# Patient Record
Sex: Female | Born: 1945
Health system: Southern US, Community
[De-identification: ages and names within clinical notes are randomized; demographics above are authoritative.]

## PROBLEM LIST (undated history)

## (undated) DIAGNOSIS — F419 Anxiety disorder, unspecified: Secondary | ICD-10-CM

## (undated) DIAGNOSIS — F329 Major depressive disorder, single episode, unspecified: Secondary | ICD-10-CM

## (undated) DIAGNOSIS — M7552 Bursitis of left shoulder: Secondary | ICD-10-CM

## (undated) DIAGNOSIS — L0390 Cellulitis, unspecified: Secondary | ICD-10-CM

## (undated) DIAGNOSIS — T7840XA Allergy, unspecified, initial encounter: Secondary | ICD-10-CM

## (undated) DIAGNOSIS — R945 Abnormal results of liver function studies: Secondary | ICD-10-CM

## (undated) DIAGNOSIS — C801 Malignant (primary) neoplasm, unspecified: Secondary | ICD-10-CM

## (undated) DIAGNOSIS — M719 Bursopathy, unspecified: Secondary | ICD-10-CM

## (undated) DIAGNOSIS — E785 Hyperlipidemia, unspecified: Secondary | ICD-10-CM

## (undated) DIAGNOSIS — H269 Unspecified cataract: Secondary | ICD-10-CM

## (undated) DIAGNOSIS — IMO0002 Reserved for concepts with insufficient information to code with codable children: Secondary | ICD-10-CM

## (undated) DIAGNOSIS — K219 Gastro-esophageal reflux disease without esophagitis: Secondary | ICD-10-CM

## (undated) DIAGNOSIS — Q288 Other specified congenital malformations of circulatory system: Secondary | ICD-10-CM

## (undated) DIAGNOSIS — Z9889 Other specified postprocedural states: Secondary | ICD-10-CM

## (undated) DIAGNOSIS — I10 Essential (primary) hypertension: Secondary | ICD-10-CM

## (undated) DIAGNOSIS — F32A Depression, unspecified: Secondary | ICD-10-CM

## (undated) DIAGNOSIS — M1990 Unspecified osteoarthritis, unspecified site: Secondary | ICD-10-CM

## (undated) HISTORY — DX: Bursopathy, unspecified: M71.9

## (undated) HISTORY — DX: Allergy, unspecified, initial encounter: T78.40XA

## (undated) HISTORY — DX: Bursitis of left shoulder: M75.52

## (undated) HISTORY — DX: Unspecified osteoarthritis, unspecified site: M19.90

## (undated) HISTORY — DX: Other specified congenital malformations of circulatory system: Q28.8

## (undated) HISTORY — DX: Reserved for concepts with insufficient information to code with codable children: IMO0002

## (undated) HISTORY — DX: Hyperlipidemia, unspecified: E78.5

## (undated) HISTORY — PX: COLONOSCOPY: SHX174

## (undated) HISTORY — DX: Essential (primary) hypertension: I10

## (undated) HISTORY — DX: Major depressive disorder, single episode, unspecified: F32.9

## (undated) HISTORY — DX: Malignant (primary) neoplasm, unspecified: C80.1

## (undated) HISTORY — PX: CATARACT EXTRACTION, BILATERAL: SHX1313

## (undated) HISTORY — DX: Anxiety disorder, unspecified: F41.9

## (undated) HISTORY — DX: Unspecified cataract: H26.9

## (undated) HISTORY — DX: Other specified postprocedural states: Z98.890

## (undated) HISTORY — PX: TUBAL LIGATION: SHX77

## (undated) HISTORY — DX: Depression, unspecified: F32.A

## (undated) HISTORY — DX: Abnormal results of liver function studies: R94.5

## (undated) HISTORY — PX: POLYPECTOMY: SHX149

## (undated) HISTORY — DX: Cellulitis, unspecified: L03.90

## (undated) HISTORY — DX: Gastro-esophageal reflux disease without esophagitis: K21.9

---

## 1974-11-20 HISTORY — PX: NASAL SINUS SURGERY: SHX719

## 1987-11-21 DIAGNOSIS — R87619 Unspecified abnormal cytological findings in specimens from cervix uteri: Secondary | ICD-10-CM

## 1987-11-21 DIAGNOSIS — IMO0002 Reserved for concepts with insufficient information to code with codable children: Secondary | ICD-10-CM

## 1987-11-21 HISTORY — PX: CERVIX LESION DESTRUCTION: SHX591

## 1987-11-21 HISTORY — DX: Unspecified abnormal cytological findings in specimens from cervix uteri: R87.619

## 1987-11-21 HISTORY — DX: Reserved for concepts with insufficient information to code with codable children: IMO0002

## 1987-11-21 HISTORY — PX: GYNECOLOGIC CRYOSURGERY: SHX857

## 1998-11-20 DIAGNOSIS — Q288 Other specified congenital malformations of circulatory system: Secondary | ICD-10-CM

## 1998-11-20 HISTORY — DX: Other specified congenital malformations of circulatory system: Q28.8

## 2002-02-14 ENCOUNTER — Other Ambulatory Visit: Admission: RE | Admit: 2002-02-14 | Discharge: 2002-02-14 | Payer: Self-pay | Admitting: Obstetrics and Gynecology

## 2003-02-17 ENCOUNTER — Other Ambulatory Visit: Admission: RE | Admit: 2003-02-17 | Discharge: 2003-02-17 | Payer: Self-pay | Admitting: Obstetrics and Gynecology

## 2004-02-24 ENCOUNTER — Other Ambulatory Visit: Admission: RE | Admit: 2004-02-24 | Discharge: 2004-02-24 | Payer: Self-pay | Admitting: Obstetrics and Gynecology

## 2005-03-17 ENCOUNTER — Other Ambulatory Visit: Admission: RE | Admit: 2005-03-17 | Discharge: 2005-03-17 | Payer: Self-pay | Admitting: Obstetrics and Gynecology

## 2005-04-21 ENCOUNTER — Ambulatory Visit: Payer: Self-pay | Admitting: Internal Medicine

## 2005-06-12 ENCOUNTER — Ambulatory Visit: Payer: Self-pay | Admitting: Internal Medicine

## 2005-06-12 ENCOUNTER — Encounter (INDEPENDENT_AMBULATORY_CARE_PROVIDER_SITE_OTHER): Payer: Self-pay | Admitting: *Deleted

## 2005-06-23 ENCOUNTER — Ambulatory Visit (HOSPITAL_COMMUNITY): Admission: RE | Admit: 2005-06-23 | Discharge: 2005-06-23 | Payer: Self-pay | Admitting: Internal Medicine

## 2006-03-30 ENCOUNTER — Other Ambulatory Visit: Admission: RE | Admit: 2006-03-30 | Discharge: 2006-03-30 | Payer: Self-pay | Admitting: Obstetrics and Gynecology

## 2007-01-19 ENCOUNTER — Encounter: Admission: RE | Admit: 2007-01-19 | Discharge: 2007-01-19 | Payer: Self-pay | Admitting: Orthopedic Surgery

## 2007-04-08 ENCOUNTER — Other Ambulatory Visit: Admission: RE | Admit: 2007-04-08 | Discharge: 2007-04-08 | Payer: Self-pay | Admitting: Obstetrics and Gynecology

## 2008-04-10 ENCOUNTER — Other Ambulatory Visit: Admission: RE | Admit: 2008-04-10 | Discharge: 2008-04-10 | Payer: Self-pay | Admitting: Obstetrics and Gynecology

## 2010-05-25 ENCOUNTER — Encounter (INDEPENDENT_AMBULATORY_CARE_PROVIDER_SITE_OTHER): Payer: Self-pay | Admitting: *Deleted

## 2010-06-23 ENCOUNTER — Telehealth: Payer: Self-pay | Admitting: Internal Medicine

## 2010-07-11 ENCOUNTER — Encounter (INDEPENDENT_AMBULATORY_CARE_PROVIDER_SITE_OTHER): Payer: Self-pay | Admitting: *Deleted

## 2010-07-11 ENCOUNTER — Ambulatory Visit: Payer: Self-pay | Admitting: Internal Medicine

## 2010-07-20 ENCOUNTER — Telehealth (INDEPENDENT_AMBULATORY_CARE_PROVIDER_SITE_OTHER): Payer: Self-pay | Admitting: *Deleted

## 2010-07-22 ENCOUNTER — Ambulatory Visit (HOSPITAL_COMMUNITY): Admission: RE | Admit: 2010-07-22 | Discharge: 2010-07-22 | Payer: Self-pay | Admitting: Internal Medicine

## 2010-07-22 ENCOUNTER — Ambulatory Visit: Payer: Self-pay | Admitting: Internal Medicine

## 2010-12-11 ENCOUNTER — Encounter: Payer: Self-pay | Admitting: Internal Medicine

## 2010-12-22 NOTE — Letter (Signed)
Summary: Colonoscopy Letter  [facility]  [address] [place], [place] [address]   Phone: [phone]  Fax: [phone]      May 25, 2010 MRN: [id number]   [facility] [address] [place], [place]  [address]   Dear Ms. Jeschke,   According to your medical record, it is time for you to schedule a Colonoscopy. The American Cancer Society recommends this procedure as a method to detect early colon cancer. Patients with a family history of colon cancer, or a personal history of colon polyps or inflammatory bowel disease are at increased risk.  This letter has beeen generated based on the recommendations made at the time of your procedure. If you feel that in your particular situation this may no longer apply, please contact our office.  Please call our office at [phone] to schedule this appointment or to update your records at your earliest convenience.  Thank you for cooperating with [place] to provide you with the very best care possible.   Sincerely,  Hedwig Morton. Juanda Chance, M.D.  [facility] [phone]

## 2010-12-22 NOTE — Procedures (Signed)
Summary: Instructions for procedure/[facility]  Instructions for procedure/[facility]   Imported By: Sherian Rein 07/12/2010 15:02:24  _____________________________________________________________________  External Attachment:    Type:   Image     Comment:   External Document

## 2010-12-22 NOTE — Letter (Signed)
Summary: [facility] Instructions  [facility]  [address] [place], [place] [address]   Phone: [phone]  Fax: [phone]       [facility] [place]    Jul 04, 1946    MRN: [id number]       Procedure Day Dorna Bloom:  Farrell Ours  99/2/11     Arrival Time:   10:00AM     Procedure Time:  11:00AM     Location of Procedure:                    _ X_  [facility] (4th Floor)  PREPARATION FOR COLONOSCOPY WITH MIRALAX  Starting 5 days prior to your procedure 07/17/10 do not eat nuts, seeds, popcorn, corn, beans, peas,  salads, or any raw vegetables.  Do not take any fiber supplements (e.g. Metamucil, Citrucel, and Benefiber). ____________________________________________________________________________________________________   THE DAY BEFORE YOUR PROCEDURE         DATE: 07/21/10  DAY: THURSDAY  1   Drink clear liquids the entire day-NO SOLID FOOD  2   Do not drink anything colored red or purple.  Avoid juices with pulp.  No orange juice.  3   Drink at least 64 oz. (8 glasses) of fluid/clear liquids during the day to prevent dehydration and help the prep work efficiently.  CLEAR LIQUIDS INCLUDE: Water Jello Ice Popsicles Tea (sugar ok, no milk/cream) Powdered fruit flavored drinks Coffee (sugar ok, no milk/cream) Gatorade Juice: apple, [place] grape, [place] cranberry  Lemonade Clear bullion, consomm, broth Carbonated beverages (any kind) Strained chicken noodle soup Hard Candy  4   Mix the entire bottle of Miralax with 64 oz. of Gatorade/Powerade in the morning and put in the refrigerator to chill.  5   At 3:00 pm take 2 Dulcolax/Bisacodyl tablets.  6   At 4:30 pm take one Reglan/Metoclopramide tablet.  7  Starting at 5:00 pm drink one 8 oz glass of the Miralax mixture every 15-20 minutes until you have finished drinking the entire 64 oz.  You should finish drinking prep around 7:30 or 8:00 pm.  8   If you are nauseated, you may take the 2nd Reglan/Metoclopramide  tablet at 6:30 pm.        9    At 8:00 pm take 2 more DULCOLAX/Bisacodyl tablets.     THE DAY OF YOUR PROCEDURE      DATE:  07/22/10   DAY: Farrell Ours  You may drink clear liquids until 7:00AM  (4 HOURS BEFORE PROCEDURE).   MEDICATION INSTRUCTIONS  Unless otherwise instructed, you should take regular prescription medications with a small sip of water as early as possible the morning of your procedure.   Additional medication instructions:   Hold Lisinopril/HCTZ the morning of procedure.         OTHER INSTRUCTIONS  You will need a responsible adult at least 65 years of age to accompany you and drive you home.   This person must remain in the waiting room during your procedure.  Wear loose fitting clothing that is easily removed.  Leave jewelry and other valuables at home.  However, you may wish to bring a book to read or an iPod/MP3 player to listen to music as you wait for your procedure to start.  Remove all body piercing jewelry and leave at home.  Total time from sign-in until discharge is approximately 2-3 hours.  You should go home directly after your procedure and rest.  You can resume normal activities the day after your  procedure.  The day of your procedure you should not:   Drive   Make legal decisions   Operate machinery   Drink alcohol   Return to work  You will receive specific instructions about eating, activities and medications before you leave.   The above instructions have been reviewed and explained to me by   Wyona Almas RN  July 11, 2010 10:22 AM     I fully understand and can verbalize these instructions _____________________________ Date _______  Appended Document: Miralax Instructions Place of procedure is at [facility].  Appended Document: Miralax Instructions Pt. notified to be NPO after midnight the night before her colon.

## 2010-12-22 NOTE — Procedures (Signed)
Summary: Colonoscopy  Patient: [name] Note: All result statuses are Final unless otherwise noted.  Tests: (1) Colonoscopy (COL)   COL Colonoscopy           DONE     [facility]     [address] [place], [place]  [address]           COLONOSCOPY PROCEDURE REPORT           PATIENT:  Joanna Cohen, Joanna Cohen  MR#:  [id number]     BIRTHDATE:  [birth year], 64 yrs. old  GENDER:  female     ENDOSCOPIST:  Hedwig Morton. Juanda Chance, MD     REF. BY:     PROCEDURE DATE:  07/22/2010     PROCEDURE:  Colonoscopy 72536     ASA CLASS:  Class II     INDICATIONS:  Routine Risk Screening     MEDICATIONS:   MAC sedation, administered by CRNA           DESCRIPTION OF PROCEDURE:   After the risks benefits and     alternatives of the procedure were thoroughly explained, informed     consent was obtained.  Digital rectal exam was performed and     revealed no rectal masses.   The EC-[id number] ([id number]) endoscope was     introduced through the anus and advanced to the cecum, which was     identified by both the appendix and ileocecal valve, without     limitations.  The quality of the prep was good, using MiraLax.     The instrument was then slowly withdrawn as the colon was fully     examined.     <<PROCEDUREIMAGES>>           FINDINGS:  No polyps or cancers were seen (see image001, image002,     image003, image004, image005, and image006). 1 shallow     diverticulum in the left colon   Retroflexed views in the rectum     revealed no abnormalities.    The scope was then withdrawn from     the patient and the procedure completed.           COMPLICATIONS:  None     ENDOSCOPIC IMPRESSION:     1) No polyps or cancers     2) Normal colonoscopy     RECOMMENDATIONS:     1) high fiber diet     REPEAT EXAM:  In 10 year(s) for.           ______________________________     Hedwig Morton. Juanda Chance, MD           CC:           n.     eSIGNED:   Hedwig Morton. Brodie at 07/22/2010 11:03 AM           [place], [place],  [phone]  Note: An exclamation mark (!) indicates a result that was not dispersed into the flowsheet. Document Creation Date: 07/22/2010 11:04 AM _______________________________________________________________________  (1) Order result status: Final Collection or observation date-time: 07/22/2010 10:59 Requested date-time:  Receipt date-time:  Reported date-time:  Referring Physician:   Ordering Physician: Lina Sar [id number]) Specimen Source:  Source: Launa Grill Order Number: [id number] Lab site:   Appended Document: Colonoscopy recall entered in IDX for 07/2020   Clinical Lists Changes  Observations: Added new observation of COLONNXTDUE: 07/2020 (07/26/2010 8:26)

## 2010-12-22 NOTE — Progress Notes (Signed)
Summary: prep ?  Phone Note Call from Patient Call back at Home Phone [phone] Call back at [phone]   Caller: Patient Call For: Dr. Juanda Chance Reason for Call: Talk to Nurse Summary of Call: prep concern Initial call taken by: Vallarie Mare,  July 20, 2010 3:24 PM  Follow-up for Phone Call        pt had questions about dietary guidelines.  Questions answered Follow-up by: Ezra Sites RN,  July 20, 2010 3:44 PM

## 2010-12-22 NOTE — Procedures (Signed)
Summary: Colonoscopy/[facility]  Colonoscopy/ Endoscopy   Imported By: Sherian Rein 06/28/2010 09:05:48  _____________________________________________________________________  External Attachment:    Type:   Image     Comment:   External Document

## 2010-12-22 NOTE — Procedures (Signed)
Summary: Medication List/[facility]  Medication List/[facility]   Imported By: Sherian Rein 07/12/2010 15:05:09  _____________________________________________________________________  External Attachment:    Type:   Image     Comment:   External Document

## 2010-12-22 NOTE — Miscellaneous (Signed)
Summary: [facility] Previsit/prep  Clinical Lists Changes  Medications: Added new medication of DULCOLAX 5 MG  TBEC (BISACODYL) Day before procedure take 2 at 3pm and 2 at 8pm. - Signed Added new medication of METOCLOPRAMIDE HCL 10 MG  TABS (METOCLOPRAMIDE HCL) As per prep instructions. - Signed Added new medication of MIRALAX   POWD (POLYETHYLENE GLYCOL 3350) As per prep  instructions. - Signed Rx of DULCOLAX 5 MG  TBEC (BISACODYL) Day before procedure take 2 at 3pm and 2 at 8pm.;  #4 x 0;  Signed;  Entered by: Wyona Almas RN;  Authorized by: Hart Carwin MD;  Method used: [facility] to [facility] [phone]*, [address] [place], [place], [place]  [address], Ph: [phone] or [phone], Fax: [phone] Rx of METOCLOPRAMIDE HCL 10 MG  TABS (METOCLOPRAMIDE HCL) As per prep instructions.;  #2 x 0;  Signed;  Entered by: Wyona Almas RN;  Authorized by: Hart Carwin MD;  Method used: Electronically to [facility] [phone]*, [address] [place], [place], [place]  [address], Ph: [phone] or [phone], Fax: [phone] Rx of MIRALAX   POWD (POLYETHYLENE GLYCOL 3350) As per prep  instructions.;  #255gm x 0;  Signed;  Entered by: Wyona Almas RN;  Authorized by: Hart Carwin MD;  Method used: Electronically to [facility] [phone]*, [address], [place], [place], [place]  [address], Ph: [phone] or [phone], Fax: [phone] Allergies: Added new allergy or adverse reaction of * POLYSPORIN Observations: Added new observation of NKA: F (07/11/2010 9:56)    Prescriptions: MIRALAX   POWD (POLYETHYLENE GLYCOL 3350) As per prep  instructions.  #255gm x 0   Entered by:   Wyona Almas RN   Authorized by:   Hart Carwin MD   Signed by:   Wyona Almas RN on 07/11/2010   Method used:   Electronically to        [facility] [phone]* (retail)       [address]       [place], [place]  [address]       Ph: [phone] or [phone]       Fax: [phone]   RxID:   [id number] METOCLOPRAMIDE HCL 10 MG  TABS (METOCLOPRAMIDE HCL) As per prep instructions.  #2 x 0   Entered by:   Wyona Almas RN   Authorized by:   Hart Carwin MD   Signed by:   Wyona Almas RN on 07/11/2010   Method used:   Electronically to        [facility] [phone]* (retail)       [address]       [place], [place]  [address]       Ph: [phone] or [phone]       Fax: [phone]   RxID:   [id number] DULCOLAX 5 MG  TBEC (BISACODYL) Day before procedure take 2 at 3pm and 2 at 8pm.  #4 x 0   Entered by:   Wyona Almas RN   Authorized by:   Hart Carwin MD   Signed by:   Wyona Almas RN on 07/11/2010   Method used:   Electronically to        [facility] [phone]* (retail)       [address]  [place], [place]  [address]       Ph: [phone] or [phone]       Fax: [phone]   RxID:   [id number]

## 2010-12-22 NOTE — Progress Notes (Signed)
Summary: sedation concern  Phone Note Call from Patient Call back at [phone]  (work #)   Caller: Patient Call For: Dr. Juanda Chance Reason for Call: Talk to Nurse Summary of Call: time for pt's rec col, but she is concerned about proper sedation... per pt, last COL Dr. Juanda Chance told her that she "fought" the procedure, sedation was increased to maximum amount, but procedure was still not fully successful... pt wants to know if she should sch this COL at the hospital Initial call taken by: Vallarie Mare,  June 23, 2010 9:07 AM  Follow-up for Phone Call        Chart ordered for review.  Report not in cori.  Lupita Leash Surface RN  June 23, 2010 9:23 AM  Chart to Dr. Juanda Chance for review. Follow-up by: Ashok Cordia RN,  June 23, 2010 10:05 AM  Additional Follow-up for Phone Call Additional follow up Details #1::        OK Additional Follow-up by: Hart Carwin MD,  June 23, 2010 11:04 PM    Additional Follow-up for Phone Call Additional follow up Details #2::    Reports in EMR for review. Follow-up by: Ashok Cordia RN,  June 28, 2010 9:34 AM  Additional Follow-up for Phone Call Additional follow up Details #3:: Details for Additional Follow-up Action Taken: Chart review. Incomplete colon  to hepatic flexure. Please schedule pt with Propofol at [facility]. during my hospital week.Please tell the pt that I have reviewed her record and this would be the best way to go. and also  much better chance of getting into her cecum. thanx Additional Follow-up by: Hart Carwin MD,  June 28, 2010 12:51 PM   Appended Document: sedation concern LM for pt to call.   Appended Document: sedation concern Colon with [facility] for  07/22/10 Arrive at 8:30.   Booking # [phone] per Sue Lush.  Appended Document: sedation concern Pt notified of appt.  Previsit scheduled.

## 2011-03-29 DIAGNOSIS — Z9889 Other specified postprocedural states: Secondary | ICD-10-CM

## 2011-03-29 HISTORY — DX: Other specified postprocedural states: Z98.890

## 2011-04-25 ENCOUNTER — Encounter: Payer: Self-pay | Admitting: Physician Assistant

## 2012-01-17 ENCOUNTER — Observation Stay (HOSPITAL_COMMUNITY)
Admission: EM | Admit: 2012-01-17 | Discharge: 2012-01-21 | DRG: 278 | Disposition: A | Discharge status: Home / Self Care | Payer: BC Managed Care – PPO | Attending: Internal Medicine | Admitting: Internal Medicine

## 2012-01-17 ENCOUNTER — Encounter (HOSPITAL_COMMUNITY): Payer: Self-pay | Admitting: Emergency Medicine

## 2012-01-17 DIAGNOSIS — L02419 Cutaneous abscess of limb, unspecified: Principal | ICD-10-CM | POA: Diagnosis present

## 2012-01-17 DIAGNOSIS — E785 Hyperlipidemia, unspecified: Secondary | ICD-10-CM | POA: Diagnosis present

## 2012-01-17 DIAGNOSIS — L0390 Cellulitis, unspecified: Secondary | ICD-10-CM

## 2012-01-17 DIAGNOSIS — L03119 Cellulitis of unspecified part of limb: Secondary | ICD-10-CM | POA: Diagnosis present

## 2012-01-17 DIAGNOSIS — M810 Age-related osteoporosis without current pathological fracture: Secondary | ICD-10-CM | POA: Diagnosis present

## 2012-01-17 DIAGNOSIS — I10 Essential (primary) hypertension: Secondary | ICD-10-CM | POA: Diagnosis present

## 2012-01-17 NOTE — ED Notes (Signed)
Patient thinks she was bite by a spider on Saturday.  Area on lateral side of right calf is red, minimal drainage to area, warm to touch, painful and swollen.

## 2012-01-18 ENCOUNTER — Emergency Department (HOSPITAL_COMMUNITY): Payer: BC Managed Care – PPO

## 2012-01-18 ENCOUNTER — Encounter (HOSPITAL_COMMUNITY): Payer: Self-pay | Admitting: Internal Medicine

## 2012-01-18 DIAGNOSIS — L03119 Cellulitis of unspecified part of limb: Secondary | ICD-10-CM | POA: Diagnosis present

## 2012-01-18 DIAGNOSIS — I10 Essential (primary) hypertension: Secondary | ICD-10-CM | POA: Diagnosis present

## 2012-01-18 DIAGNOSIS — E785 Hyperlipidemia, unspecified: Secondary | ICD-10-CM | POA: Diagnosis present

## 2012-01-18 LAB — CBC
HCT: 36.8 % (ref 36.0–46.0)
Hemoglobin: 12.4 g/dL (ref 12.0–15.0)
MCH: 29.3 pg (ref 26.0–34.0)
MCHC: 33.5 g/dL (ref 30.0–36.0)
MCHC: 33.7 g/dL (ref 30.0–36.0)
Platelets: 335 10*3/uL (ref 150–400)
RBC: 4.18 MIL/uL (ref 3.87–5.11)
RDW: 13.7 % (ref 11.5–15.5)

## 2012-01-18 LAB — COMPREHENSIVE METABOLIC PANEL
ALT: 76 U/L — ABNORMAL HIGH (ref 0–35)
AST: 41 U/L — ABNORMAL HIGH (ref 0–37)
Alkaline Phosphatase: 82 U/L (ref 39–117)
CO2: 23 mEq/L (ref 19–32)
Chloride: 106 mEq/L (ref 96–112)
GFR calc non Af Amer: 87 mL/min — ABNORMAL LOW (ref >90)
Sodium: 138 mEq/L (ref 135–145)
Total Bilirubin: 0.4 mg/dL (ref 0.3–1.2)

## 2012-01-18 LAB — POCT I-STAT, CHEM 8
Creatinine, Ser: 0.9 mg/dL (ref 0.50–1.10)
Hemoglobin: 12.6 g/dL (ref 12.0–15.0)
Sodium: 138 mEq/L (ref 135–145)
TCO2: 22 mmol/L (ref 0–100)

## 2012-01-18 LAB — DIFFERENTIAL
Lymphocytes Relative: 16 % (ref 12–46)
Neutro Abs: 8.5 10*3/uL — ABNORMAL HIGH (ref 1.7–7.7)
Neutrophils Relative %: 71 % (ref 43–77)

## 2012-01-18 LAB — CK TOTAL AND CKMB (NOT AT ARMC): Relative Index: INVALID (ref 0.0–2.5)

## 2012-01-18 MED ORDER — ACETAMINOPHEN 325 MG PO TABS
650.0000 mg | ORAL_TABLET | Freq: Four times a day (QID) | ORAL | Status: DC | PRN
  Administered 2012-01-19: 650 mg via ORAL
  Filled 2012-01-18: qty 2

## 2012-01-18 MED ORDER — LISINOPRIL 20 MG PO TABS
20.0000 mg | ORAL_TABLET | Freq: Every day | ORAL | Status: DC
  Administered 2012-01-18 – 2012-01-21 (×4): 20 mg via ORAL
  Filled 2012-01-18 (×4): qty 1

## 2012-01-18 MED ORDER — TETANUS-DIPHTH-ACELL PERTUSSIS 5-2.5-18.5 LF-MCG/0.5 IM SUSP
0.5000 mL | Freq: Once | INTRAMUSCULAR | Status: AC
  Administered 2012-01-18: 0.5 mL via INTRAMUSCULAR
  Filled 2012-01-18: qty 0.5

## 2012-01-18 MED ORDER — FLUTICASONE PROPIONATE 50 MCG/ACT NA SUSP
2.0000 | Freq: Every day | NASAL | Status: DC
  Administered 2012-01-18 – 2012-01-21 (×4): 2 via NASAL
  Filled 2012-01-18: qty 16

## 2012-01-18 MED ORDER — AMLODIPINE BESYLATE 5 MG PO TABS
5.0000 mg | ORAL_TABLET | Freq: Every day | ORAL | Status: DC
Start: 2012-01-18 — End: 2012-01-21
  Administered 2012-01-18 – 2012-01-21 (×4): 5 mg via ORAL
  Filled 2012-01-18 (×4): qty 1

## 2012-01-18 MED ORDER — PIPERACILLIN-TAZOBACTAM 3.375 G IVPB
3.3750 g | Freq: Three times a day (TID) | INTRAVENOUS | Status: DC
  Administered 2012-01-18 – 2012-01-21 (×9): 3.375 g via INTRAVENOUS
  Filled 2012-01-18 (×11): qty 50

## 2012-01-18 MED ORDER — VANCOMYCIN HCL IN DEXTROSE 1-5 GM/200ML-% IV SOLN
1000.0000 mg | Freq: Two times a day (BID) | INTRAVENOUS | Status: DC
  Administered 2012-01-18 – 2012-01-21 (×7): 1000 mg via INTRAVENOUS
  Filled 2012-01-18 (×8): qty 200

## 2012-01-18 MED ORDER — SIMVASTATIN 40 MG PO TABS
40.0000 mg | ORAL_TABLET | Freq: Every evening | ORAL | Status: DC
  Administered 2012-01-18 – 2012-01-19 (×2): 40 mg via ORAL
  Filled 2012-01-18 (×3): qty 1

## 2012-01-18 MED ORDER — ACETAMINOPHEN 650 MG RE SUPP: 650.0000 mg | Freq: Four times a day (QID) | RECTAL | Status: DC | PRN

## 2012-01-18 MED ORDER — SODIUM CHLORIDE 0.9 % IV SOLN
INTRAVENOUS | Status: DC
  Administered 2012-01-18 – 2012-01-21 (×3): via INTRAVENOUS

## 2012-01-18 MED ORDER — VANCOMYCIN HCL IN DEXTROSE 1-5 GM/200ML-% IV SOLN
1000.0000 mg | Freq: Once | INTRAVENOUS | Status: AC
  Administered 2012-01-18: 1000 mg via INTRAVENOUS
  Filled 2012-01-18: qty 200

## 2012-01-18 MED ORDER — VENLAFAXINE HCL ER 75 MG PO CP24
75.0000 mg | ORAL_CAPSULE | Freq: Every day | ORAL | Status: DC
  Administered 2012-01-18 – 2012-01-21 (×4): 75 mg via ORAL
  Filled 2012-01-18 (×4): qty 1

## 2012-01-18 MED ORDER — ONDANSETRON HCL 4 MG/2ML IJ SOLN: 4.0000 mg | Freq: Four times a day (QID) | INTRAMUSCULAR | Status: DC | PRN

## 2012-01-18 MED ORDER — HYDROCHLOROTHIAZIDE 25 MG PO TABS
25.0000 mg | ORAL_TABLET | Freq: Every day | ORAL | Status: DC
  Administered 2012-01-18 – 2012-01-21 (×4): 25 mg via ORAL
  Filled 2012-01-18 (×4): qty 1

## 2012-01-18 MED ORDER — ASPIRIN EC 81 MG PO TBEC
81.0000 mg | DELAYED_RELEASE_TABLET | Freq: Every day | ORAL | Status: DC
  Administered 2012-01-18 – 2012-01-21 (×4): 81 mg via ORAL
  Filled 2012-01-18 (×4): qty 1

## 2012-01-18 MED ORDER — ONDANSETRON HCL 4 MG PO TABS: 4.0000 mg | ORAL_TABLET | Freq: Four times a day (QID) | ORAL | Status: DC | PRN

## 2012-01-18 MED ORDER — ENOXAPARIN SODIUM 40 MG/0.4ML ~~LOC~~ SOLN
40.0000 mg | SUBCUTANEOUS | Status: DC
  Administered 2012-01-18 – 2012-01-21 (×4): 40 mg via SUBCUTANEOUS
  Filled 2012-01-18 (×4): qty 0.4

## 2012-01-18 MED ORDER — FENTANYL CITRATE 0.05 MG/ML IJ SOLN
50.0000 ug | Freq: Once | INTRAMUSCULAR | Status: DC
  Filled 2012-01-18: qty 2

## 2012-01-18 MED ORDER — LISINOPRIL-HYDROCHLOROTHIAZIDE 20-25 MG PO TABS: 1.0000 | ORAL_TABLET | Freq: Every day | ORAL | Status: DC

## 2012-01-18 MED ORDER — PIPERACILLIN-TAZOBACTAM 3.375 G IVPB
3.3750 g | Freq: Once | INTRAVENOUS | Status: AC
  Administered 2012-01-18: 3.375 g via INTRAVENOUS
  Filled 2012-01-18: qty 50

## 2012-01-18 NOTE — Progress Notes (Signed)
Utilization review completed.  

## 2012-01-18 NOTE — ED Provider Notes (Signed)
History     CSN: [id number]  Arrival date & time 01/17/12  2114   First MD Initiated Contact with Patient 01/17/12 2355      Chief Complaint  Patient presents with  . Insect Bite    (Consider location/radiation/quality/duration/timing/severity/associated sxs/prior treatment) Patient is a 66 y.o. female presenting with rash. The history is provided by the patient. No language interpreter was used.  Rash  This is a new problem. The current episode started more than 2 days ago. The problem has been gradually worsening. The problem is associated with nothing. There has been no fever. The rash is present on the right lower leg. The pain is at a severity of 6/10. The pain is moderate. The pain has been constant since onset. Associated symptoms include pain. Associated symptoms comments: drainage. Treatments tried: oral antibiotics. The treatment provided no relief. Risk factors: none.  Patient thought she may have been bitten by a spider.  But now recalls hitting the shin with a car door.  Shaved in that area and it was red and scabbed.  Now red all over and swelling from the foot up and was started on more antibiotics by PMD.    Past Medical History  Diagnosis Date  . Osteoporosis   . Cellulitis   . History of drainage of abscess 03/29/11    Wound care (rt. hand 4th digit )  . Hypertension   . Hyperlipidemia     History reviewed. No pertinent past surgical history.  History reviewed. No pertinent family history.  History  Substance Use Topics  . Smoking status: Never Smoker   . Smokeless tobacco: Not on file  . Alcohol Use: No    OB History    Grav Para Term Preterm Abortions TAB SAB Ect Mult Living                  Review of Systems  Constitutional: Negative for fever.  HENT: Negative.   Eyes: Negative.   Respiratory: Negative.   Cardiovascular: Negative.   Gastrointestinal: Negative.   Musculoskeletal: Positive for joint swelling.  Skin: Positive for color change,  rash and wound.  Neurological: Negative.   Hematological: Negative.   Psychiatric/Behavioral: Negative.     Allergies  Bacitracin-polymyxin b  Home Medications   Current Outpatient Rx  Name Route Sig Dispense Refill  . ALENDRONATE SODIUM 70 MG PO TABS Oral Take 70 mg by mouth every 7 (seven) days. Take with a full glass of water on an empty stomach.    . AMLODIPINE BESYLATE 5 MG PO TABS Oral Take 5 mg by mouth daily.      . ASPIRIN 81 MG PO TBEC Oral Take 81 mg by mouth daily.      Marland Kitchen CALTRATE 600 PLUS-VIT D PO Oral Take by mouth 2 (two) times daily.      Marland Kitchen CIPROFLOXACIN HCL 500 MG PO TABS Oral Take 500 mg by mouth 2 (two) times daily.    Marland Kitchen CLINDAMYCIN HCL 300 MG PO CAPS Oral Take 300 mg by mouth 2 (two) times daily.    . ERGOCALCIFEROL 50000 UNITS PO CAPS Oral Take 50,000 Units by mouth once a week.      Marland Kitchen FLUTICASONE PROPIONATE 50 MCG/ACT NA SUSP Nasal Place 2 sprays into the nose daily.    Marland Kitchen LISINOPRIL-HYDROCHLOROTHIAZIDE 20-25 MG PO TABS Oral Take 1 tablet by mouth daily.      . CENTRUM SILVER PO Oral Take 1 tablet by mouth daily.     Marland Kitchen SIMVASTATIN  40 MG PO TABS Oral Take 40 mg by mouth every evening.    . SULFAMETHOXAZOLE-TMP DS 800-160 MG PO TABS Oral Take 1 tablet by mouth 2 (two) times daily. Started on Monday 2/25    . VENLAFAXINE HCL ER 75 MG PO CP24 Oral Take 75 mg by mouth daily.      BP 120/61  Pulse 83  Temp(Src) 98.4 F (36.9 C) (Oral)  Resp 16  SpO2 97%  Physical Exam  Constitutional: She is oriented to person, place, and time. She appears well-developed and well-nourished.  HENT:  Head: Normocephalic and atraumatic.  Mouth/Throat: Oropharynx is clear and moist.  Eyes: Conjunctivae are normal. Pupils are equal, round, and reactive to light.  Neck: Normal range of motion. Neck supple.  Cardiovascular: Normal rate and regular rhythm.   Pulmonary/Chest: Effort normal and breath sounds normal.  Abdominal: Soft. Bowel sounds are normal. There is no tenderness.  There is no rebound and no guarding.  Musculoskeletal: She exhibits edema.       Legs:      Swelling from foot to the thigh  Neurological: She is alert and oriented to person, place, and time.  Skin: Skin is warm and dry. She is not diaphoretic.       See note  Psychiatric: She has a normal mood and affect.    ED Course  Procedures (including critical care time)  Labs Reviewed  CBC - Abnormal; Notable for the following:    WBC 12.0 (*)    All other components within normal limits  DIFFERENTIAL - Abnormal; Notable for the following:    Neutro Abs 8.5 (*)    Monocytes Absolute 1.2 (*)    All other components within normal limits  CULTURE, BLOOD (ROUTINE X 2)  CULTURE, BLOOD (ROUTINE X 2)  CK TOTAL AND CKMB   Dg Tibia/fibula Right  01/18/2012  *RADIOLOGY REPORT*  Clinical Data: Insect bite at the right lower leg, with erythema.  RIGHT TIBIA AND FIBULA - 2 VIEW  Comparison: None.  Findings: The known insect bite is suggested along the lateral aspect of the right lower leg.  No significant soft tissue abnormalities are otherwise seen.  No radiopaque foreign bodies are identified.  There is no evidence of osseous disruption.  The knee joint is incompletely imaged, but appears grossly unremarkable.  The ankle mortise is incompletely assessed, but remains within normal limits.  IMPRESSION: No evidence of osseous disruption; no significant soft tissue abnormalities characterized underlying the suspected location of the insect bite along the lateral aspect of the right lower leg. No radiopaque foreign bodies identified.  Original Report Authenticated By: Tonia Ghent, M.D.     1. Cellulitis       MDM  admit        Tinaya Ceballos K Malaiyah Achorn-Rasch, MD 01/18/12 [phone]

## 2012-01-18 NOTE — Progress Notes (Addendum)
ANTIBIOTIC CONSULT NOTE - INITIAL  Pharmacy Consult for Vancomycin and Zosyn Indication: cellulitis  Allergies  Allergen Reactions  . Bacitracin-Polymyxin B     REACTION: red around area of ointment    Patient Measurements: Height: 5' (152.4 cm) Weight: 132 lb 9.6 oz (60.147 kg) IBW/kg (Calculated) : 45.5   Vital Signs: Temp: 97.7 F (36.5 C) (02/28 0504) Temp src: Oral (02/28 0504) BP: 123/75 mmHg (02/28 0504) Pulse Rate: 85  (02/28 0504) Labs:  Basename 01/18/12 0004  WBC 12.0*  HGB 12.4  PLT 349  LABCREA --  CREATININE --   CrCl is unknown because no creatinine reading has been taken.   Medical History: Past Medical History  Diagnosis Date  . Osteoporosis   . Cellulitis   . History of drainage of abscess 03/29/11    Wound care (rt. hand 4th digit )  . Hypertension   . Hyperlipidemia     Medications:  Prescriptions prior to admission  Medication Sig Dispense Refill  . alendronate (FOSAMAX) 70 MG tablet Take 70 mg by mouth every 7 (seven) days. Take with a full glass of water on an empty stomach.      Marland Kitchen amLODipine (NORVASC) 5 MG tablet Take 5 mg by mouth daily.        Marland Kitchen aspirin (BAYER ASPIRIN EC LOW DOSE) 81 MG EC tablet Take 81 mg by mouth daily.        . Calcium-Vitamin D (CALTRATE 600 PLUS-VIT D PO) Take by mouth 2 (two) times daily.        . ciprofloxacin (CIPRO) 500 MG tablet Take 500 mg by mouth 2 (two) times daily.      . clindamycin (CLEOCIN) 300 MG capsule Take 300 mg by mouth 2 (two) times daily.      . ergocalciferol (VITAMIN D2) 50000 UNITS capsule Take 50,000 Units by mouth once a week.        . fluticasone (FLONASE) 50 MCG/ACT nasal spray Place 2 sprays into the nose daily.      Marland Kitchen lisinopril-hydrochlorothiazide (PRINZIDE,ZESTORETIC) 20-25 MG per tablet Take 1 tablet by mouth daily.        . Multiple Vitamins-Minerals (CENTRUM SILVER PO) Take 1 tablet by mouth daily.       . simvastatin (ZOCOR) 40 MG tablet Take 40 mg by mouth every evening.        . sulfamethoxazole-trimethoprim (BACTRIM DS) 800-160 MG per tablet Take 1 tablet by mouth 2 (two) times daily. Started on Monday 2/25      . venlafaxine (EFFEXOR-XR) 75 MG 24 hr capsule Take 75 mg by mouth daily.       Assessment: 66 yo female with R LE cellulitis for empiric antibiotics Vancomycin 1 g IV in ED at 0330  Goal of Therapy:  Vancomycin trough level 10-15 mcg/ml  Plan:  Vancomycin 1 g IV q12h Zosyn 3.375 g IV q8h  Taygan Connell, Gary Fleet 01/18/2012,5:19 AM

## 2012-01-18 NOTE — Consult Note (Signed)
WOC consult Note Reason for Consult: Consult requested for right leg wound.  Pt states she felt an insect bite site and also bumped against car door during past week.  On Vancomycin IV, radiological studies do not identify any drainable abscess to site. Wound type: Partial thickness cellulitis, no open wounds at present. Measurement: .2X.2cm dry scab, next to this is 1X1cm area of erythemia and edema; appears to be previous blister which has ruptured.  Center of red area darker red, not fluctuant when probed, no open wound, small drainage. Wound bed: Dressing procedure/placement/frequency: Foam dressing to protect site and absorb drainage.  If no s/s improvement in 48 hours, then may require I&D. Will not plan to follow further unless re-consulted.  [address] RN, MSN, [employer]  [phone]

## 2012-01-18 NOTE — H&P (Addendum)
Joanna Cohen is an 66 y.o. female.   PCP - Dr.Moore in [place]. Chief Complaint: Right lower extremity swelling erythema and discharge. HPI: 66 year-old female with history of hypertension hyperlipidemia around last Friday that is approximately a week ago felt that something was crawling on her leg. The next day she saw a small pimple-like lesion on the anterior shin of her right leg which slowly got worse. 2 days later at her workplace she visited the physician assistant who prescribed Bactrim. Despite taking which the swelling did not improve. She again went yesterday to the PA at her workplace, who prescribed her 2 more antibiotics. But when she went back home and had a look at the leg the swelling had progressed fast. And this made her come to the ER. She also noticed the swelling had started discharge of the last 2 days. Patient also had hit this leg 2 weeks ago on the car door and had sustained a small injury there. Patient denies any fever chills.  Past Medical History  Diagnosis Date  . Osteoporosis   . Cellulitis   . History of drainage of abscess 03/29/11    Wound care (rt. hand 4th digit )  . Hypertension   . Hyperlipidemia     History reviewed. No pertinent past surgical history.  History reviewed. No pertinent family history. Social History:  reports that she has never smoked. She does not have any smokeless tobacco history on file. She reports that she does not drink alcohol or use illicit drugs.  Allergies:  Allergies  Allergen Reactions  . Bacitracin-Polymyxin B     REACTION: red around area of ointment    Medications Prior to Admission  Medication Dose Route Frequency Provider Last Rate Last Dose  . fentaNYL (SUBLIMAZE) injection 50 mcg  50 mcg Intravenous Once April K Palumbo-Rasch, MD      . piperacillin-tazobactam (ZOSYN) IVPB 3.375 g  3.375 g Intravenous Once April K Palumbo-Rasch, MD   3.375 g at 01/18/12 0329  . TDaP (BOOSTRIX) injection 0.5 mL  0.5 mL  Intramuscular Once April K Palumbo-Rasch, MD   0.5 mL at 01/18/12 0031  . vancomycin (VANCOCIN) IVPB 1000 mg/200 mL premix  1,000 mg Intravenous Once April K Palumbo-Rasch, MD   1,000 mg at 01/18/12 1610   Medications Prior to Admission  Medication Sig Dispense Refill  . amLODipine (NORVASC) 5 MG tablet Take 5 mg by mouth daily.        Marland Kitchen aspirin (BAYER ASPIRIN EC LOW DOSE) 81 MG EC tablet Take 81 mg by mouth daily.        . Calcium-Vitamin D (CALTRATE 600 PLUS-VIT D PO) Take by mouth 2 (two) times daily.        . ergocalciferol (VITAMIN D2) 50000 UNITS capsule Take 50,000 Units by mouth once a week.        Marland Kitchen lisinopril-hydrochlorothiazide (PRINZIDE,ZESTORETIC) 20-25 MG per tablet Take 1 tablet by mouth daily.        . Multiple Vitamins-Minerals (CENTRUM SILVER PO) Take 1 tablet by mouth daily.         Results for orders placed during the hospital encounter of 01/17/12 (from the past 48 hour(s))  CBC     Status: Abnormal   Collection Time   01/18/12 12:04 AM      Component Value Range Comment   WBC 12.0 (*) 4.0 - 10.5 (K/uL)    RBC 4.18  3.87 - 5.11 (MIL/uL)    Hemoglobin 12.4  12.0 - 15.0 (  g/dL)    HCT 45.4  09.8 - 11.9 (%)    MCV 88.0  78.0 - 100.0 (fL)    MCH 29.7  26.0 - 34.0 (pg)    MCHC 33.7  30.0 - 36.0 (g/dL)    RDW 14.7  82.9 - 56.2 (%)    Platelets 349  150 - 400 (K/uL)   DIFFERENTIAL     Status: Abnormal   Collection Time   01/18/12 12:04 AM      Component Value Range Comment   Neutrophils Relative 71  43 - 77 (%)    Neutro Abs 8.5 (*) 1.7 - 7.7 (K/uL)    Lymphocytes Relative 16  12 - 46 (%)    Lymphs Abs 1.9  0.7 - 4.0 (K/uL)    Monocytes Relative 10  3 - 12 (%)    Monocytes Absolute 1.2 (*) 0.1 - 1.0 (K/uL)    Eosinophils Relative 3  0 - 5 (%)    Eosinophils Absolute 0.3  0.0 - 0.7 (K/uL)    Basophils Relative 1  0 - 1 (%)    Basophils Absolute 0.1  0.0 - 0.1 (K/uL)   CK TOTAL AND CKMB     Status: Normal   Collection Time   01/18/12  3:09 AM      Component Value  Range Comment   Total CK 69  7 - 177 (U/L)    CK, MB 1.8  0.3 - 4.0 (ng/mL)    Relative Index RELATIVE INDEX IS INVALID  0.0 - 2.5     Dg Tibia/fibula Right  01/18/2012  *RADIOLOGY REPORT*  Clinical Data: Insect bite at the right lower leg, with erythema.  RIGHT TIBIA AND FIBULA - 2 VIEW  Comparison: None.  Findings: The known insect bite is suggested along the lateral aspect of the right lower leg.  No significant soft tissue abnormalities are otherwise seen.  No radiopaque foreign bodies are identified.  There is no evidence of osseous disruption.  The knee joint is incompletely imaged, but appears grossly unremarkable.  The ankle mortise is incompletely assessed, but remains within normal limits.  IMPRESSION: No evidence of osseous disruption; no significant soft tissue abnormalities characterized underlying the suspected location of the insect bite along the lateral aspect of the right lower leg. No radiopaque foreign bodies identified.  Original Report Authenticated By: Tonia Ghent, M.D.   Ct Tibia Fibula Right Wo Contrast  01/18/2012  *RADIOLOGY REPORT*  Clinical Data: Hit right lower leg with car door 2 days ago; swelling and erythema.  Question of infection.  CT OF THE RIGHT TIBIA FIBULA WITHOUT CONTRAST  Comparison: Right tibia / fibula radiographs performed earlier today at 12:39 a.m.  Findings: There is mild diffuse soft tissue edema noted along the lateral aspect of the right lower leg.  More focal soft tissue swelling is noted at the mid right calf; no foreign body is seen.  Trace fluid is seen tracking along the fascia and adjacent to the musculature.  No focal fluid collection is seen to suggest abscess. Mild diffuse edema is noted along the lateral musculature, without evidence of focal soft tissue hematoma.  More diffuse edema is noted along the lateral aspect of the left knee, with minimal soft tissue edema noted on the medial aspect of the left knee.  No knee joint effusion is  identified.  At the level of the ankle, there is mild diffuse soft tissue swelling surrounding the ankle.  Findings are compatible with diffuse cellulitis.  Evaluation of  the vasculature is limited without contrast.  There is no evidence of fracture.  The tibia and fibula appear intact.  Both knee joints are grossly unremarkable in appearance. The ankle mortise is preserved bilaterally.  IMPRESSION:  1.  No evidence of abscess. 2.  Mild diffuse soft tissue edema and trace fluid noted along the lateral aspect of the right lower leg, tracking about the knee and about the ankle, with overlying skin thickening.  Findings compatible with diffuse cellulitis. 3.  Associated soft tissue edema noted involving the lateral aspect of the musculature along the right calf; no evidence of soft tissue hematoma. 4.  More focal soft tissue swelling noted at the mid right calf; no foreign body seen.  Original Report Authenticated By: Tonia Ghent, M.D.    Review of Systems  Constitutional: Negative.   HENT: Negative.   Eyes: Negative.   Respiratory: Negative.   Cardiovascular: Negative.   Gastrointestinal: Negative.   Genitourinary: Negative.   Musculoskeletal:       Swelling erythema and discharge from the right leg.  Skin: Negative.   Neurological: Negative.   Endo/Heme/Allergies: Negative.   Psychiatric/Behavioral: Negative.     Blood pressure 120/61, pulse 83, temperature 98.4 F (36.9 C), temperature source Oral, resp. rate 16, SpO2 97.00%. Physical Exam  Constitutional: She is oriented to person, place, and time. She appears well-developed and well-nourished. No distress.  HENT:  Head: Normocephalic and atraumatic.  Right Ear: External ear normal.  Left Ear: External ear normal.  Nose: Nose normal.  Mouth/Throat: Oropharynx is clear and moist. No oropharyngeal exudate.  Eyes: Conjunctivae are normal. Pupils are equal, round, and reactive to light. Right eye exhibits no discharge. Left eye exhibits  no discharge. No scleral icterus.  Neck: Normal range of motion. Neck supple.  Cardiovascular: Normal rate and regular rhythm.   Respiratory: Effort normal and breath sounds normal. No respiratory distress. She has no wheezes.  GI: Soft. Bowel sounds are normal. She exhibits no distension. There is no tenderness. There is no rebound.  Musculoskeletal:       There is diffuse swelling of the right leg below the knee extending up to the foot with an area around 4 cm anterior shin which is erythematous with discharge. Just near the punctum is also a healed area. Patient has no restriction of knee or ankle joints movement.  Neurological: She is alert and oriented to person, place, and time.       Most all extremities 5 x 5.  Skin: She is not diaphoretic. There is erythema.       See description in musculoskeletal.  Psychiatric: Her behavior is normal.     Assessment/Plan #1. Cellulitis of the right lower extremity with discharge - at this time CAT scan does not show any drainable abscess patient does have active discharge from the wound. We will continue with vancomycin and Zosyn for now. Patient had received tetanus vaccination in the ER. If the swelling does not get better in a day or 2 probably may have to do an MRI to look for any deep abscess formation. I will consult wound team for now. #2. History of hypertension and hyperlipidemia - continue present medications.   Patient's i-STAT labs are not crossing over to epic. As per the ER physician the i-STAT was normal. I have ordered a metabolic panel now.  CODE STATUS - full code.  Eduard Clos. 01/18/2012, 4:46 AM

## 2012-01-18 NOTE — Progress Notes (Signed)
Subjective: Patient seen and examined this morning.admission H&P reviewed.   Objective:  Vital signs in last 24 hours:  Filed Vitals:   01/18/12 0212 01/18/12 0215 01/18/12 0230 01/18/12 0504  BP:  125/59 120/61 123/75  Pulse:  84 83 85  Temp: 98.4 F (36.9 C)   97.7 F (36.5 C)  TempSrc: Oral   Oral  Resp:    18  Height:    5' (1.524 m)  Weight:    60.147 kg (132 lb 9.6 oz)  SpO2:  96% 97% 96%    Intake/Output from previous day:  No intake or output data in the 24 hours ending 01/18/12 0959  Physical Exam:  General: elderly  in no acute distress. HEENT: no pallor, no icterus, moist oral mucosa, no JVD, no lymphadenopathy Heart: Normal  s1 &s2  Regular rate and rhythm, without murmurs, rubs, gallops. Lungs: Clear to auscultation bilaterally. Abdomen: Soft, nontender, nondistended, positive bowel sounds. Extremities:  diffuse swelling of 4 cm right anterior shin which is erythematous with no discharge on my exam . Just near the punctum is also a healed area. Patient has no restriction of knee or ankle joints movement. Surrounding skin is tense and non tender  Neuro: Alert, awake, oriented x3, nonfocal.   Lab Results:  Basic Metabolic Panel:    Component Value Date/Time   NA 138 01/18/2012 0625   K 3.5 01/18/2012 0625   CL 106 01/18/2012 0625   CO2 23 01/18/2012 0625   BUN 16 01/18/2012 0625   CREATININE 0.74 01/18/2012 0625   GLUCOSE 107* 01/18/2012 0625   CALCIUM 8.4 01/18/2012 0625   CBC:    Component Value Date/Time   WBC 9.8 01/18/2012 0625   HGB 11.5* 01/18/2012 0625   HCT 34.3* 01/18/2012 0625   PLT 335 01/18/2012 0625   MCV 87.5 01/18/2012 0625   NEUTROABS 8.5* 01/18/2012 0004   LYMPHSABS 1.9 01/18/2012 0004   MONOABS 1.2* 01/18/2012 0004   EOSABS 0.3 01/18/2012 0004   BASOSABS 0.1 01/18/2012 0004    No results found for this or any previous visit (from the past 240 hour(s)).  Studies/Results: Dg Tibia/fibula Right  01/18/2012  *RADIOLOGY REPORT*  Clinical  Data: Insect bite at the right lower leg, with erythema.  RIGHT TIBIA AND FIBULA - 2 VIEW  Comparison: None.  Findings: The known insect bite is suggested along the lateral aspect of the right lower leg.  No significant soft tissue abnormalities are otherwise seen.  No radiopaque foreign bodies are identified.  There is no evidence of osseous disruption.  The knee joint is incompletely imaged, but appears grossly unremarkable.  The ankle mortise is incompletely assessed, but remains within normal limits.  IMPRESSION: No evidence of osseous disruption; no significant soft tissue abnormalities characterized underlying the suspected location of the insect bite along the lateral aspect of the right lower leg. No radiopaque foreign bodies identified.  Original Report Authenticated By: Tonia Ghent, M.D.   Ct Tibia Fibula Right Wo Contrast  01/18/2012  *RADIOLOGY REPORT*  Clinical Data: Hit right lower leg with car door 2 days ago; swelling and erythema.  Question of infection.  CT OF THE RIGHT TIBIA FIBULA WITHOUT CONTRAST  Comparison: Right tibia / fibula radiographs performed earlier today at 12:39 a.m.  Findings: There is mild diffuse soft tissue edema noted along the lateral aspect of the right lower leg.  More focal soft tissue swelling is noted at the mid right calf; no foreign body is seen.  Trace fluid is  seen tracking along the fascia and adjacent to the musculature.  No focal fluid collection is seen to suggest abscess. Mild diffuse edema is noted along the lateral musculature, without evidence of focal soft tissue hematoma.  More diffuse edema is noted along the lateral aspect of the left knee, with minimal soft tissue edema noted on the medial aspect of the left knee.  No knee joint effusion is identified.  At the level of the ankle, there is mild diffuse soft tissue swelling surrounding the ankle.  Findings are compatible with diffuse cellulitis.  Evaluation of the vasculature is limited without  contrast.  There is no evidence of fracture.  The tibia and fibula appear intact.  Both knee joints are grossly unremarkable in appearance. The ankle mortise is preserved bilaterally.  IMPRESSION:  1.  No evidence of abscess. 2.  Mild diffuse soft tissue edema and trace fluid noted along the lateral aspect of the right lower leg, tracking about the knee and about the ankle, with overlying skin thickening.  Findings compatible with diffuse cellulitis. 3.  Associated soft tissue edema noted involving the lateral aspect of the musculature along the right calf; no evidence of soft tissue hematoma. 4.  More focal soft tissue swelling noted at the mid right calf; no foreign body seen.  Original Report Authenticated By: Tonia Ghent, M.D.    Medications: Scheduled Meds:   . amLODipine  5 mg Oral Daily  . aspirin EC  81 mg Oral Daily  . enoxaparin  40 mg Subcutaneous Q24H  . fluticasone  2 spray Each Nare Daily  . hydrochlorothiazide  25 mg Oral Daily  . lisinopril  20 mg Oral Daily  . piperacillin-tazobactam (ZOSYN)  IV  3.375 g Intravenous Once  . piperacillin-tazobactam (ZOSYN)  IV  3.375 g Intravenous Q8H  . simvastatin  40 mg Oral QPM  . TDaP  0.5 mL Intramuscular Once  . vancomycin  1,000 mg Intravenous Once  . vancomycin  1,000 mg Intravenous Q12H  . venlafaxine  75 mg Oral Daily  . DISCONTD: fentaNYL  50 mcg Intravenous Once  . DISCONTD: lisinopril-hydrochlorothiazide  1 tablet Oral Daily   Continuous Infusions:   . sodium chloride 75 mL/hr at 01/18/12 0530   PRN Meds:.acetaminophen, acetaminophen, ondansetron (ZOFRAN) IV, ondansetron  Assessment/Plan:  Cellulitis of rt leg  cotn IV vanco and zosyn  CT unremarkable for abscess  Will follow clinically  appreciate wound consult recs  if not improved on IV abx over next 24 -48 hrs and or gets worse clinically will repeat imagine ( MRI) to evaluate for any soft tissue abscess  Rest of the plan per H&P   LOS: 1 day   Darolyn Double,  Floyed Masoud 01/18/2012, 9:59 AM

## 2012-01-18 NOTE — ED Notes (Signed)
Admitting physician at bedside

## 2012-01-19 DIAGNOSIS — L03119 Cellulitis of unspecified part of limb: Secondary | ICD-10-CM

## 2012-01-19 DIAGNOSIS — L02419 Cutaneous abscess of limb, unspecified: Secondary | ICD-10-CM

## 2012-01-19 LAB — VANCOMYCIN, TROUGH: Vancomycin Tr: 13.2 ug/mL (ref 10.0–20.0)

## 2012-01-19 NOTE — Progress Notes (Signed)
Subjective: Patient seen and examined this am. Noted bloody discharge from the wound today. Swelling slightly reduced. Feels less pain  Objective:  Vital signs in last 24 hours:  Filed Vitals:   01/18/12 1441 01/18/12 2200 01/19/12 0600 01/19/12 1002  BP: 126/64 130/75 122/77 123/72  Pulse: 84 79 74 79  Temp: 97.9 F (36.6 C) 98.2 F (36.8 C) 97.3 F (36.3 C) 98.3 F (36.8 C)  TempSrc: Oral   Oral  Resp: 18 16 16 18   Height:      Weight:      SpO2: 97% 97% 96% 95%    Intake/Output from previous day:   Intake/Output Summary (Last 24 hours) at 01/19/12 1124 Last data filed at 01/19/12 0900  Gross per 24 hour  Intake    400 ml  Output      0 ml  Net    400 ml    Physical Exam:  General: elderly in no acute distress.  HEENT: no pallor, no icterus, moist oral mucosa, no JVD, no lymphadenopathy  Heart: Normal s1 &s2 Regular rate and rhythm, without murmurs, rubs, gallops.  Lungs: Clear to auscultation bilaterally.  Abdomen: Soft, nontender, nondistended, positive bowel sounds.  Extremities: diffuse swelling of 4 cm right anterior shin which is erythematous with some serosanguinous discharge.swelling mildly reduced compared to yesterday. Just near the punctum is also a healed area. Patient has no restriction of knee or ankle joints movement. Surrounding skin is tense and non tender  Neuro: Alert, awake, oriented x3, nonfocal.  Lab Results:  Basic Metabolic Panel:    Component Value Date/Time   NA 138 01/18/2012 0625   K 3.5 01/18/2012 0625   CL 106 01/18/2012 0625   CO2 23 01/18/2012 0625   BUN 16 01/18/2012 0625   CREATININE 0.74 01/18/2012 0625   GLUCOSE 107* 01/18/2012 0625   CALCIUM 8.4 01/18/2012 0625   CBC:    Component Value Date/Time   WBC 9.8 01/18/2012 0625   HGB 11.5* 01/18/2012 0625   HCT 34.3* 01/18/2012 0625   PLT 335 01/18/2012 0625   MCV 87.5 01/18/2012 0625   NEUTROABS 8.5* 01/18/2012 0004   LYMPHSABS 1.9 01/18/2012 0004   MONOABS 1.2* 01/18/2012 0004   EOSABS 0.3 01/18/2012 0004   BASOSABS 0.1 01/18/2012 0004    Recent Results (from the past 240 hour(s))  CULTURE, BLOOD (ROUTINE X 2)     Status: Normal (Preliminary result)   Collection Time   01/18/12 12:15 AM      Component Value Range Status Comment   Specimen Description BLOOD RIGHT ARM   Final    Special Requests BOTTLES DRAWN AEROBIC AND ANAEROBIC 10CC   Final    Culture  Setup Time [id number]   Final    Culture     Final    Value:        BLOOD CULTURE RECEIVED NO GROWTH TO DATE CULTURE WILL BE HELD FOR 5 DAYS BEFORE ISSUING A FINAL NEGATIVE REPORT   Report Status PENDING   Incomplete   CULTURE, BLOOD (ROUTINE X 2)     Status: Normal (Preliminary result)   Collection Time   01/18/12 12:20 AM      Component Value Range Status Comment   Specimen Description BLOOD RIGHT HAND   Final    Special Requests BOTTLES DRAWN AEROBIC ONLY [facility]   Final    Culture  Setup Time [id number]   Final    Culture     Final    Value:  BLOOD CULTURE RECEIVED NO GROWTH TO DATE CULTURE WILL BE HELD FOR 5 DAYS BEFORE ISSUING A FINAL NEGATIVE REPORT   Report Status PENDING   Incomplete     Studies/Results: Dg Tibia/fibula Right  01/18/2012  *RADIOLOGY REPORT*  Clinical Data: Insect bite at the right lower leg, with erythema.  RIGHT TIBIA AND FIBULA - 2 VIEW  Comparison: None.  Findings: The known insect bite is suggested along the lateral aspect of the right lower leg.  No significant soft tissue abnormalities are otherwise seen.  No radiopaque foreign bodies are identified.  There is no evidence of osseous disruption.  The knee joint is incompletely imaged, but appears grossly unremarkable.  The ankle mortise is incompletely assessed, but remains within normal limits.  IMPRESSION: No evidence of osseous disruption; no significant soft tissue abnormalities characterized underlying the suspected location of the insect bite along the lateral aspect of the right lower leg. No radiopaque foreign bodies  identified.  Original Report Authenticated By: Tonia Ghent, M.D.   Ct Tibia Fibula Right Wo Contrast  01/18/2012  *RADIOLOGY REPORT*  Clinical Data: Hit right lower leg with car door 2 days ago; swelling and erythema.  Question of infection.  CT OF THE RIGHT TIBIA FIBULA WITHOUT CONTRAST  Comparison: Right tibia / fibula radiographs performed earlier today at 12:39 a.m.  Findings: There is mild diffuse soft tissue edema noted along the lateral aspect of the right lower leg.  More focal soft tissue swelling is noted at the mid right calf; no foreign body is seen.  Trace fluid is seen tracking along the fascia and adjacent to the musculature.  No focal fluid collection is seen to suggest abscess. Mild diffuse edema is noted along the lateral musculature, without evidence of focal soft tissue hematoma.  More diffuse edema is noted along the lateral aspect of the left knee, with minimal soft tissue edema noted on the medial aspect of the left knee.  No knee joint effusion is identified.  At the level of the ankle, there is mild diffuse soft tissue swelling surrounding the ankle.  Findings are compatible with diffuse cellulitis.  Evaluation of the vasculature is limited without contrast.  There is no evidence of fracture.  The tibia and fibula appear intact.  Both knee joints are grossly unremarkable in appearance. The ankle mortise is preserved bilaterally.  IMPRESSION:  1.  No evidence of abscess. 2.  Mild diffuse soft tissue edema and trace fluid noted along the lateral aspect of the right lower leg, tracking about the knee and about the ankle, with overlying skin thickening.  Findings compatible with diffuse cellulitis. 3.  Associated soft tissue edema noted involving the lateral aspect of the musculature along the right calf; no evidence of soft tissue hematoma. 4.  More focal soft tissue swelling noted at the mid right calf; no foreign body seen.  Original Report Authenticated By: Tonia Ghent, M.D.     Medications: Scheduled Meds:   . amLODipine  5 mg Oral Daily  . aspirin EC  81 mg Oral Daily  . enoxaparin  40 mg Subcutaneous Q24H  . fluticasone  2 spray Each Nare Daily  . hydrochlorothiazide  25 mg Oral Daily  . lisinopril  20 mg Oral Daily  . piperacillin-tazobactam (ZOSYN)  IV  3.375 g Intravenous Q8H  . simvastatin  40 mg Oral QPM  . vancomycin  1,000 mg Intravenous Q12H  . venlafaxine  75 mg Oral Daily   Continuous Infusions:   . sodium chloride 75  mL/hr at 01/19/12 0625   PRN Meds:.acetaminophen, acetaminophen, ondansetron (ZOFRAN) IV, ondansetron  Assessment  66 y/o female with HTN, HL ,presented with cellulitis of rt leg with some discharge. No signs of abscess on CT scan.  /Plan: Cellulitis of rt leg  cotn IV vanco and zosyn ( DAY 2) CT unremarkable for abscess  Very slow improvement in size of cellulitis and with serosanguinous discharge appreciate wound consult recs  Will get surgery consult to evaluate for possible I&D  HTN/ HL  cont home meds  stable  DVT prophylaxis  Full code   LOS: 2 days   Ijeoma Loor 01/19/2012, 11:24 AM

## 2012-01-19 NOTE — Consult Note (Signed)
[facility] - ATTENDING: Patient seen and examined.  Limited cellulitis right lower extremity.  Small brownish drainage in bandages this evening.  Minimal tenderness.  On IV vancomycin and Zosyn.  Will follow. Velora Heckler, MD, [facility] Office: [phone]

## 2012-01-19 NOTE — Consult Note (Signed)
Reason for Consult:(R)leg cellulitis Consulting Surgeon: Gerrit Friends Referring Physician: Dhungel   HPI: Joanna Cohen is an 66 y.o. female who was admitted for cellulitis of her (R)leg. SHe bumped her leg on car door about 1-2 weeks ago, which did cause a small cut to her skin. However, this healed well and a scab remains. A few days ago, the skin in that same area became red and warm and swollen. It intensified and a small blister formed. She has been admitted and been on abx. A CT scan showed no focal abscess and since admission, she has felt better. The WBC is down and her swelling is some better, but there is an open wound draining some pus and Surgery consult requested to look at the area.  Past Medical History:  Past Medical History  Diagnosis Date  . Osteoporosis   . Cellulitis   . History of drainage of abscess 03/29/11    Wound care (rt. hand 4th digit )  . Hypertension   . Hyperlipidemia     Surgical History:  Past Surgical History  Procedure Date  . Other surgical history 40 yrs ago    surgery to repair bone in nose    Family History: History reviewed. No pertinent family history.  Social History:  reports that she has never smoked. She does not have any smokeless tobacco history on file. She reports that she does not drink alcohol or use illicit drugs.  Allergies:  Allergies  Allergen Reactions  . Bacitracin-Polymyxin B     REACTION: red around area of ointment    Medications:  Prior to Admission:  Prescriptions prior to admission  Medication Sig Dispense Refill  . alendronate (FOSAMAX) 70 MG tablet Take 70 mg by mouth every 7 (seven) days. Take with a full glass of water on an empty stomach.      Marland Kitchen amLODipine (NORVASC) 5 MG tablet Take 5 mg by mouth daily.        Marland Kitchen aspirin (BAYER ASPIRIN EC LOW DOSE) 81 MG EC tablet Take 81 mg by mouth daily.        . Calcium-Vitamin D (CALTRATE 600 PLUS-VIT D PO) Take by mouth 2 (two) times daily.        . ciprofloxacin (CIPRO)  500 MG tablet Take 500 mg by mouth 2 (two) times daily.      . clindamycin (CLEOCIN) 300 MG capsule Take 300 mg by mouth 2 (two) times daily.      . ergocalciferol (VITAMIN D2) 50000 UNITS capsule Take 50,000 Units by mouth once a week.        . fluticasone (FLONASE) 50 MCG/ACT nasal spray Place 2 sprays into the nose daily.      Marland Kitchen lisinopril-hydrochlorothiazide (PRINZIDE,ZESTORETIC) 20-25 MG per tablet Take 1 tablet by mouth daily.        . Multiple Vitamins-Minerals (CENTRUM SILVER PO) Take 1 tablet by mouth daily.       . simvastatin (ZOCOR) 40 MG tablet Take 40 mg by mouth every evening.      . sulfamethoxazole-trimethoprim (BACTRIM DS) 800-160 MG per tablet Take 1 tablet by mouth 2 (two) times daily. Started on Monday 2/25      . venlafaxine (EFFEXOR-XR) 75 MG 24 hr capsule Take 75 mg by mouth daily.        ROS: See HPI for pertinent findings, otherwise complete 10 system review negative.  Physical Exam: Blood pressure 123/72, pulse 79, temperature 98.3 F (36.8 C), temperature source Oral, resp. rate 18, height  5' (1.524 m), weight 60.147 kg (132 lb 9.6 oz), SpO2 95.00%.  General Appearance:  Alert, cooperative, no distress, appears stated age  Extremities: (L) lower extremities normal, atraumatic, no cyanosis or edema (R)LE with area of erythema and swelling on mid-lateral aspect of lower leg. Central scab c/w prior trauma. Lateral adjacent to that scab, is an area of excoriated skin with a central punctate wound. I was able to express some purulent material from this wound, but not much. Skin was warm and tender.   Pulses: 2+ and symmetric  Neurologic: Normal affect, no gross deficits. NVI     Labs: CBC  Basename 01/18/12 0625 01/18/12 0029 01/18/12 0004  WBC 9.8 -- 12.0*  HGB 11.5* 12.6 --  HCT 34.3* 37.0 --  PLT 335 -- 349   MET  Basename 01/18/12 0625 01/18/12 0029  NA 138 138  K 3.5 3.4*  CL 106 107  CO2 23 --  GLUCOSE 107* 112*  BUN 16 23  CREATININE 0.74 0.90    CALCIUM 8.4 --    Basename 01/18/12 0625  PROT 6.8  ALBUMIN 3.2*  AST 41*  ALT 76*  ALKPHOS 82  BILITOT 0.4  BILIDIR --  IBILI --  LIPASE --   PT/INR No results found for this basename: LABPROT:2,INR:2 in the last 72 hours ABG No results found for this basename: PHART:2,PCO2:2,PO2:2,HCO3:2 in the last 72 hours    Dg Tibia/fibula Right  01/18/2012  *RADIOLOGY REPORT*  Clinical Data: Insect bite at the right lower leg, with erythema.  RIGHT TIBIA AND FIBULA - 2 VIEW  Comparison: None.  Findings: The known insect bite is suggested along the lateral aspect of the right lower leg.  No significant soft tissue abnormalities are otherwise seen.  No radiopaque foreign bodies are identified.  There is no evidence of osseous disruption.  The knee joint is incompletely imaged, but appears grossly unremarkable.  The ankle mortise is incompletely assessed, but remains within normal limits.  IMPRESSION: No evidence of osseous disruption; no significant soft tissue abnormalities characterized underlying the suspected location of the insect bite along the lateral aspect of the right lower leg. No radiopaque foreign bodies identified.  Original Report Authenticated By: Tonia Ghent, M.D.   Ct Tibia Fibula Right Wo Contrast  01/18/2012  *RADIOLOGY REPORT*  Clinical Data: Hit right lower leg with car door 2 days ago; swelling and erythema.  Question of infection.  CT OF THE RIGHT TIBIA FIBULA WITHOUT CONTRAST  Comparison: Right tibia / fibula radiographs performed earlier today at 12:39 a.m.  Findings: There is mild diffuse soft tissue edema noted along the lateral aspect of the right lower leg.  More focal soft tissue swelling is noted at the mid right calf; no foreign body is seen.  Trace fluid is seen tracking along the fascia and adjacent to the musculature.  No focal fluid collection is seen to suggest abscess. Mild diffuse edema is noted along the lateral musculature, without evidence of focal soft  tissue hematoma.  More diffuse edema is noted along the lateral aspect of the left knee, with minimal soft tissue edema noted on the medial aspect of the left knee.  No knee joint effusion is identified.  At the level of the ankle, there is mild diffuse soft tissue swelling surrounding the ankle.  Findings are compatible with diffuse cellulitis.  Evaluation of the vasculature is limited without contrast.  There is no evidence of fracture.  The tibia and fibula appear intact.  Both knee joints are  grossly unremarkable in appearance. The ankle mortise is preserved bilaterally.  IMPRESSION:  1.  No evidence of abscess. 2.  Mild diffuse soft tissue edema and trace fluid noted along the lateral aspect of the right lower leg, tracking about the knee and about the ankle, with overlying skin thickening.  Findings compatible with diffuse cellulitis. 3.  Associated soft tissue edema noted involving the lateral aspect of the musculature along the right calf; no evidence of soft tissue hematoma. 4.  More focal soft tissue swelling noted at the mid right calf; no foreign body seen.  Original Report Authenticated By: Tonia Ghent, M.D.    Assessment/Plan: Principal Problem:  *Cellulitis of leg Active Problems:  HTN (hypertension)  Hyperlipidemia  It appears a small focal abscess did form but has already spontaneously drained. Cellulitis is improving per primary team and pt. WBC has normalized. Would continue abx and dry dressing. Will reassess tomorrow am. If still able to express significant pus, then may need to open wound a little more, which could be done at bedside.  Marianna Fuss PA-C 01/19/2012, 12:04 PM

## 2012-01-19 NOTE — Progress Notes (Signed)
ANTIBIOTIC CONSULT NOTE - FOLLOW UP  Pharmacy Consult for vancomyicn/zosyn Indication: cellulitis  Allergies  Allergen Reactions  . Bacitracin-Polymyxin B     REACTION: red around area of ointment    Patient Measurements: Height: 5' (152.4 cm) Weight: 132 lb 9.6 oz (60.147 kg) IBW/kg (Calculated) : 45.5    Vital Signs: Temp: 98.1 F (36.7 C) (03/01 2122) Temp src: Oral (03/01 2122) BP: 112/68 mmHg (03/01 2122) Pulse Rate: 78  (03/01 2122) Intake/Output from previous day:   Intake/Output from this shift:    Labs:  Basename 01/18/12 0625 01/18/12 0029 01/18/12 0004  WBC 9.8 -- 12.0*  HGB 11.5* 12.6 12.4  PLT 335 -- 349  LABCREA -- -- --  CREATININE 0.74 0.90 --   Estimated Creatinine Clearance: 56.8 ml/min (by C-G formula based on Cr of 0.74).  Basename 01/19/12 2120  VANCOTROUGH 13.2  VANCOPEAK --  VANCORANDOM --  GENTTROUGH --  GENTPEAK --  GENTRANDOM --  TOBRATROUGH --  TOBRAPEAK --  TOBRARND --  AMIKACINPEAK --  AMIKACINTROU --  AMIKACIN --     Microbiology: Recent Results (from the past 720 hour(s))  CULTURE, BLOOD (ROUTINE X 2)     Status: Normal (Preliminary result)   Collection Time   01/18/12 12:15 AM      Component Value Range Status Comment   Specimen Description BLOOD RIGHT ARM   Final    Special Requests BOTTLES DRAWN AEROBIC AND ANAEROBIC 10CC   Final    Culture  Setup Time [id number]   Final    Culture     Final    Value:        BLOOD CULTURE RECEIVED NO GROWTH TO DATE CULTURE WILL BE HELD FOR 5 DAYS BEFORE ISSUING A FINAL NEGATIVE REPORT   Report Status PENDING   Incomplete   CULTURE, BLOOD (ROUTINE X 2)     Status: Normal (Preliminary result)   Collection Time   01/18/12 12:20 AM      Component Value Range Status Comment   Specimen Description BLOOD RIGHT HAND   Final    Special Requests BOTTLES DRAWN AEROBIC ONLY [facility]   Final    Culture  Setup Time [id number]   Final    Culture     Final    Value:        BLOOD CULTURE RECEIVED  NO GROWTH TO DATE CULTURE WILL BE HELD FOR 5 DAYS BEFORE ISSUING A FINAL NEGATIVE REPORT   Report Status PENDING   Incomplete     Anti-infectives     Start     Dose/Rate Route Frequency Ordered Stop   01/18/12 1200  piperacillin-tazobactam (ZOSYN) IVPB 3.375 g       3.375 g 12.5 mL/hr over 240 Minutes Intravenous 3 times per day 01/18/12 0526     01/18/12 1000   vancomycin (VANCOCIN) IVPB 1000 mg/200 mL premix        1,000 mg 200 mL/hr over 60 Minutes Intravenous Every 12 hours 01/18/12 0526     01/18/12 0245   vancomycin (VANCOCIN) IVPB 1000 mg/200 mL premix        1,000 mg 200 mL/hr over 60 Minutes Intravenous  Once 01/18/12 0241 01/18/12 0429   01/18/12 0245  piperacillin-tazobactam (ZOSYN) IVPB 3.375 g       3.375 g 12.5 mL/hr over 240 Minutes Intravenous  Once 01/18/12 0241 01/18/12 4696          Assessment: 66 yo female with R leg cellulitis currently on day #2 vancomycin/zosyn. WBC  now trending down to 9.8, no fevers noted, no abscess noted on CT, blood cxs ngtd, renal function stable. Vancomycin trough within desired goal at 13.2. Will continue current regimen.  Goal of Therapy:  Vancomycin trough level 10-15 mcg/ml  Plan:  Continue vancomycin 1g q 12hours Zosyn 3.375 g q8 hours Recheck vancomycin in a week if therapy is to continue. Severiano Gilbert 01/19/2012,10:26 PM

## 2012-01-20 MED ORDER — ATORVASTATIN CALCIUM 10 MG PO TABS
20.0000 mg | ORAL_TABLET | Freq: Every day | ORAL | Status: DC
  Administered 2012-01-20: 20 mg via ORAL
  Filled 2012-01-20 (×2): qty 2

## 2012-01-20 NOTE — Progress Notes (Signed)
  Subjective: Pt looks and feels ok. Leg feels better. Swelling down.  Objective: Vital signs in last 24 hours: Temp:  [97.8 F (36.6 C)-98.3 F (36.8 C)] 98.1 F (36.7 C) (03/02 0448) Pulse Rate:  [74-79] 74  (03/02 0448) Resp:  [18-20] 18  (03/02 0448) BP: (112-123)/(68-72) 118/72 mmHg (03/02 0448) SpO2:  [95 %-98 %] 96 % (03/02 0448) Last BM Date: 01/18/12  Intake/Output this shift:    Physical Exam: BP 118/72  Pulse 74  Temp(Src) 98.1 F (36.7 C) (Oral)  Resp 18  Ht 5' (1.524 m)  Wt 60.147 kg (132 lb 9.6 oz)  BMI 25.90 kg/m2  SpO2 96% (L)LE edema and erythema improved. Wound stable, I couldn't really get any more purulence to express and no fluctuance to suggest cont abscess  Labs: CBC  Basename 01/18/12 0625 01/18/12 0029 01/18/12 0004  WBC 9.8 -- 12.0*  HGB 11.5* 12.6 --  HCT 34.3* 37.0 --  PLT 335 -- 349   BMET  Basename 01/18/12 0625 01/18/12 0029  NA 138 138  K 3.5 3.4*  CL 106 107  CO2 23 --  GLUCOSE 107* 112*  BUN 16 23  CREATININE 0.74 0.90  CALCIUM 8.4 --   LFT  Basename 01/18/12 0625  PROT 6.8  ALBUMIN 3.2*  AST 41*  ALT 76*  ALKPHOS 82  BILITOT 0.4  BILIDIR --  IBILI --  LIPASE --   PT/INR No results found for this basename: LABPROT:2,INR:2 in the last 72 hours ABG No results found for this basename: PHART:2,PCO2:2,PO2:2,HCO3:2 in the last 72 hours  Studies/Results: No results found.  Assessment: Principal Problem:  *Cellulitis of leg Active Problems:  HTN (hypertension)  Hyperlipidemia     Plan: No need for further I&D. She has spontaneously drained this and has responded well to abx. Recommended shower daily, do not submerge. Dry dressing daily. Keep leg elevated when resting. Looks ok for discharge from our view. Abx of choice, but Doxy or Septra DS prob sufficient and would cover MRSA. Follow up with PCP.   LOS: 3 days    Marianna Fuss PA-C 01/20/2012 10:01 AM

## 2012-01-20 NOTE — Discharge Instructions (Signed)
Recommended shower daily, do not submerge. Do not apply alcohol, peroxide, betadine, ointments or creams. Dry dressing daily. Keep leg elevated when resting. Follow up with PCP.

## 2012-01-20 NOTE — Progress Notes (Signed)
Subjective: Patient seen and examined this am. Feels better. Minimal drainage from wound side.   Objective:  Vital signs in last 24 hours:  Filed Vitals:   01/19/12 1002 01/19/12 1437 01/19/12 2122 01/20/12 0448  BP: 123/72 119/68 112/68 118/72  Pulse: 79 79 78 74  Temp: 98.3 F (36.8 C) 97.8 F (36.6 C) 98.1 F (36.7 C) 98.1 F (36.7 C)  TempSrc: Oral Oral Oral Oral  Resp: 18 20 18 18   Height:      Weight:      SpO2: 95% 98% 97% 96%    Intake/Output from previous day:   Intake/Output Summary (Last 24 hours) at 01/20/12 1159 Last data filed at 01/19/12 1300  Gross per 24 hour  Intake    360 ml  Output      0 ml  Net    360 ml    Physical Exam:  . General: elderly in no acute distress.  HEENT: no pallor, no icterus, moist oral mucosa, no JVD, no lymphadenopathy  Heart: Normal s1 &s2 Regular rate and rhythm, without murmurs, rubs, gallops.  Lungs: Clear to auscultation bilaterally.  Abdomen: Soft, nontender, nondistended, positive bowel sounds.  Extremities: diffuse swelling of 4 cm right anterior shin which is erythematous with minimal  serosanguinous discharge.swelling mildly reduced compared to yesterday. Just near the punctum is also a healed area. Patient has no restriction of knee or ankle joints movement. Surrounding skin is tense and non tender  Neuro: Alert, awake, oriented x3, nonfocal.   Lab Results:  Basic Metabolic Panel:    Component Value Date/Time   NA 138 01/18/2012 0625   K 3.5 01/18/2012 0625   CL 106 01/18/2012 0625   CO2 23 01/18/2012 0625   BUN 16 01/18/2012 0625   CREATININE 0.74 01/18/2012 0625   GLUCOSE 107* 01/18/2012 0625   CALCIUM 8.4 01/18/2012 0625   CBC:    Component Value Date/Time   WBC 9.8 01/18/2012 0625   HGB 11.5* 01/18/2012 0625   HCT 34.3* 01/18/2012 0625   PLT 335 01/18/2012 0625   MCV 87.5 01/18/2012 0625   NEUTROABS 8.5* 01/18/2012 0004   LYMPHSABS 1.9 01/18/2012 0004   MONOABS 1.2* 01/18/2012 0004   EOSABS 0.3 01/18/2012  0004   BASOSABS 0.1 01/18/2012 0004    Recent Results (from the past 240 hour(s))  CULTURE, BLOOD (ROUTINE X 2)     Status: Normal (Preliminary result)   Collection Time   01/18/12 12:15 AM      Component Value Range Status Comment   Specimen Description BLOOD RIGHT ARM   Final    Special Requests BOTTLES DRAWN AEROBIC AND ANAEROBIC 10CC   Final    Culture  Setup Time [id number]   Final    Culture     Final    Value:        BLOOD CULTURE RECEIVED NO GROWTH TO DATE CULTURE WILL BE HELD FOR 5 DAYS BEFORE ISSUING A FINAL NEGATIVE REPORT   Report Status PENDING   Incomplete   CULTURE, BLOOD (ROUTINE X 2)     Status: Normal (Preliminary result)   Collection Time   01/18/12 12:20 AM      Component Value Range Status Comment   Specimen Description BLOOD RIGHT HAND   Final    Special Requests BOTTLES DRAWN AEROBIC ONLY [facility]   Final    Culture  Setup Time [id number]   Final    Culture     Final    Value:  BLOOD CULTURE RECEIVED NO GROWTH TO DATE CULTURE WILL BE HELD FOR 5 DAYS BEFORE ISSUING A FINAL NEGATIVE REPORT   Report Status PENDING   Incomplete     Studies/Results: No results found.  Medications: Scheduled Meds:   . amLODipine  5 mg Oral Daily  . aspirin EC  81 mg Oral Daily  . enoxaparin  40 mg Subcutaneous Q24H  . fluticasone  2 spray Each Nare Daily  . hydrochlorothiazide  25 mg Oral Daily  . lisinopril  20 mg Oral Daily  . piperacillin-tazobactam (ZOSYN)  IV  3.375 g Intravenous Q8H  . simvastatin  40 mg Oral QPM  . vancomycin  1,000 mg Intravenous Q12H  . venlafaxine  75 mg Oral Daily   Continuous Infusions:   . sodium chloride 75 mL/hr at 01/19/12 0625   PRN Meds:.acetaminophen, acetaminophen, ondansetron (ZOFRAN) IV, ondansetron    Assessment  66 y/o female with HTN, HL ,presented with cellulitis of rt leg with some discharge. No signs of abscess on CT scan.   /Plan:  Cellulitis of rt leg  cotn IV vanco and zosyn ( DAY 3)  CT unremarkable for  abscess   improvement in size of cellulitis and with minimal serosanguinous discharge noted, non tender to exam appreciate wound consult recs  cx negative Seen by surgery and recommend to treat with abx and dose not need surgical drainage as no underlying abscess  HTN/ HL  cont home meds  stable   DVT prophylaxis   Full code   Discharge home tomorrow on po abx if stable overnight    LOS: 3 days   Jahid Weida 01/20/2012, 11:59 AM

## 2012-01-20 NOTE — Progress Notes (Signed)
[facility] ([facility]) - ATTENDING: Improving cellulitis.  Does not require surgical drainage.  As above. Velora Heckler, MD, [facility] Office: [phone]

## 2012-01-21 MED ORDER — DOXYCYCLINE HYCLATE 100 MG PO TABS: 100.0000 mg | ORAL_TABLET | Freq: Two times a day (BID) | ORAL | Status: AC

## 2012-01-21 MED ORDER — SIMVASTATIN 40 MG PO TABS: 20.0000 mg | ORAL_TABLET | Freq: Every evening | ORAL | Status: DC

## 2012-01-21 NOTE — Discharge Summary (Signed)
Patient ID: Joanna Cohen MRN: [id number] DOB/AGE: 1946-02-08 66 y.o.  Admit date: 01/17/2012 Discharge date: 01/21/2012  Primary Care Physician:  Rudi Heap, MD, MD  Discharge Diagnoses:     Principal Problem:  *Cellulitis of leg  Active Problems:  HTN (hypertension)  Hyperlipidemia   Medication List  As of 01/21/2012  9:11 AM   STOP taking these medications         ciprofloxacin 500 MG tablet      clindamycin 300 MG capsule      sulfamethoxazole-trimethoprim 800-160 MG per tablet         TAKE these medications         alendronate 70 MG tablet   Commonly known as: FOSAMAX   Take 70 mg by mouth every 7 (seven) days. Take with a full glass of water on an empty stomach.      amLODipine 5 MG tablet   Commonly known as: NORVASC   Take 5 mg by mouth daily.      BAYER ASPIRIN EC LOW DOSE 81 MG EC tablet   Generic drug: aspirin   Take 81 mg by mouth daily.      CALTRATE 600 PLUS-VIT D PO   Take by mouth 2 (two) times daily.      CENTRUM SILVER PO   Take 1 tablet by mouth daily.      doxycycline 100 MG tablet   Commonly known as: VIBRA-TABS   Take 1 tablet (100 mg total) by mouth 2 (two) times daily.      ergocalciferol 50000 UNITS capsule   Commonly known as: VITAMIN D2   Take 50,000 Units by mouth once a week.      fluticasone 50 MCG/ACT nasal spray   Commonly known as: FLONASE   Place 2 sprays into the nose daily.      lisinopril-hydrochlorothiazide 20-25 MG per tablet   Commonly known as: PRINZIDE,ZESTORETIC   Take 1 tablet by mouth daily.      simvastatin 40 MG tablet   Commonly known as: ZOCOR   Take 0.5 tablets (20 mg total) by mouth every evening.      venlafaxine 75 MG 24 hr capsule   Commonly known as: EFFEXOR-XR   Take 75 mg by mouth daily.            Disposition and Follow-up:  Home with  PCP follow up in 1 week  Consults:   Gerkin ( surgery)  Significant Diagnostic Studies:  Dg Tibia/fibula Right  01-28-2012  *RADIOLOGY  REPORT*  Clinical Data: Insect bite at the right lower leg, with erythema.  RIGHT TIBIA AND FIBULA - 2 VIEW  Comparison: None.  Findings: The known insect bite is suggested along the lateral aspect of the right lower leg.  No significant soft tissue abnormalities are otherwise seen.  No radiopaque foreign bodies are identified.  There is no evidence of osseous disruption.  The knee joint is incompletely imaged, but appears grossly unremarkable.  The ankle mortise is incompletely assessed, but remains within normal limits.  IMPRESSION: No evidence of osseous disruption; no significant soft tissue abnormalities characterized underlying the suspected location of the insect bite along the lateral aspect of the right lower leg. No radiopaque foreign bodies identified.  Original Report Authenticated By: Tonia Ghent, M.D.   Ct Tibia Fibula Right Wo Contrast  2012/01/28  *RADIOLOGY REPORT*  Clinical Data: Hit right lower leg with car door 2 days ago; swelling and erythema.  Question of infection.  CT OF THE  RIGHT TIBIA FIBULA WITHOUT CONTRAST  Comparison: Right tibia / fibula radiographs performed earlier today at 12:39 a.m.  Findings: There is mild diffuse soft tissue edema noted along the lateral aspect of the right lower leg.  More focal soft tissue swelling is noted at the mid right calf; no foreign body is seen.  Trace fluid is seen tracking along the fascia and adjacent to the musculature.  No focal fluid collection is seen to suggest abscess. Mild diffuse edema is noted along the lateral musculature, without evidence of focal soft tissue hematoma.  More diffuse edema is noted along the lateral aspect of the left knee, with minimal soft tissue edema noted on the medial aspect of the left knee.  No knee joint effusion is identified.  At the level of the ankle, there is mild diffuse soft tissue swelling surrounding the ankle.  Findings are compatible with diffuse cellulitis.  Evaluation of the vasculature is  limited without contrast.  There is no evidence of fracture.  The tibia and fibula appear intact.  Both knee joints are grossly unremarkable in appearance. The ankle mortise is preserved bilaterally.  IMPRESSION:  1.  No evidence of abscess. 2.  Mild diffuse soft tissue edema and trace fluid noted along the lateral aspect of the right lower leg, tracking about the knee and about the ankle, with overlying skin thickening.  Findings compatible with diffuse cellulitis. 3.  Associated soft tissue edema noted involving the lateral aspect of the musculature along the right calf; no evidence of soft tissue hematoma. 4.  More focal soft tissue swelling noted at the mid right calf; no foreign body seen.  Original Report Authenticated By: Tonia Ghent, M.D.    Brief H and P: For complete details please refer to admission H and P, but in brief 66 year-old female with history of hypertension hyperlipidemia around last Friday that is approximately a week ago felt that something was crawling on her leg. The next day she saw a small pimple-like lesion on the anterior shin of her right leg which slowly got worse. 2 days later at her workplace she visited the physician assistant who prescribed Bactrim. Despite taking which the swelling did not improve. She again went yesterday to the PA at her workplace, who prescribed her 2 more antibiotics. But when she went back home and had a look at the leg the swelling had progressed fast. And this made her come to the ER. She also noticed the swelling had started discharge of the last 2 days. Patient also had hit this leg 2 weeks ago on the car door and had sustained a small injury there. Patient denies any fever chills.   Physical Exam on Discharge:  Filed Vitals:   01/20/12 0448 01/20/12 1337 01/20/12 2218 01/21/12 0514  BP: 118/72 121/73 129/75 146/74  Pulse: 74 87 77 76  Temp: 98.1 F (36.7 C) 98.4 F (36.9 C) 98.5 F (36.9 C) 98.3 F (36.8 C)  TempSrc: Oral Oral Oral  Oral  Resp: 18 18 20 18   Height:      Weight:      SpO2: 96% 94% 95% 95%     Intake/Output Summary (Last 24 hours) at 01/21/12 0911 Last data filed at 01/20/12 1700  Gross per 24 hour  Intake   1120 ml  Output      0 ml  Net   1120 ml   General: elderly in no acute distress.  HEENT: no pallor, no icterus, moist oral mucosa, no JVD,  no lymphadenopathy  Heart: Normal s1 &s2 Regular rate and rhythm, without murmurs, rubs, gallops.  Lungs: Clear to auscultation bilaterally.  Abdomen: Soft, nontender, nondistended, positive bowel sounds.  Extremities: diffuse swelling of 4 cm right anterior shin which was erythematous on presentation and now resolved with minimal serosanguinous discharge on the main area..swelling markedly reduced comparatively. Just near the punctum is also a healed area. Patient has no restriction of knee or ankle joints movement. Surrounding skin is tense and non tender  Neuro: Alert, awake, oriented x3, nonfocal.  CBC:    Component Value Date/Time   WBC 9.8 01/18/2012 0625   HGB 11.5* 01/18/2012 0625   HCT 34.3* 01/18/2012 0625   PLT 335 01/18/2012 0625   MCV 87.5 01/18/2012 0625   NEUTROABS 8.5* 01/18/2012 0004   LYMPHSABS 1.9 01/18/2012 0004   MONOABS 1.2* 01/18/2012 0004   EOSABS 0.3 01/18/2012 0004   BASOSABS 0.1 01/18/2012 0004    Basic Metabolic Panel:    Component Value Date/Time   NA 138 01/18/2012 0625   K 3.5 01/18/2012 0625   CL 106 01/18/2012 0625   CO2 23 01/18/2012 0625   BUN 16 01/18/2012 0625   CREATININE 0.74 01/18/2012 0625   GLUCOSE 107* 01/18/2012 0625   CALCIUM 8.4 01/18/2012 0625    Hospital Course:  Cellulitis of rt leg  Patient started on  IV vanco and zosyn( day 4 today) CT scan unremarkable for abscess  Now with significant improvement in size of cellulitis and with minimal serosanguinous discharge noted, non tender to exam  appreciate wound consult recs  . To apply dry dressing  Blood cx negative  Seen by surgery and recommend to  treat with abx and dose not need surgical drainage as no underlying abscess  She received 4 days of vanco and zosyn will discharge her on po doxycycline 100 mg bid to complete a 10 day course.   HYypertension'  Stable  Hyperlipidemia Will reduce dose of zocor to 20 gm daily given mild transaminates on admission  Patient stable for discharge home with outpt follow up  Time spent on Discharge: 45 minutes  Signed: Jhalen Eley 01/21/2012, 9:11 AM

## 2012-01-22 NOTE — Progress Notes (Signed)
   CARE MANAGEMENT NOTE 01/22/2012  Patient:  Joanna Cohen, Joanna Cohen   Account Number:  [id number]  Date Initiated:  01/22/2012  Documentation initiated by:  Letha Cape  Subjective/Objective Assessment:   admit  cellulits     Action/Plan:   Anticipated DC Date:  01/21/2012   Anticipated DC Plan:  HOME/SELF CARE      DC Planning Services  CM consult      Choice offered to / List presented to:             Status of service:  Completed, signed off Medicare Important Message given?   (If response is "NO", the following Medicare IM given date fields will be blank) Date Medicare IM given:   Date Additional Medicare IM given:    Discharge Disposition:  HOME/SELF CARE  Per UR Regulation:    Comments:  01/22/12 10:19 Letha Cape RN, BSN [phone] Patient lives with family, pta independent.

## 2012-01-24 LAB — CULTURE, BLOOD (ROUTINE X 2)
Culture  Setup Time: 201302280822
Culture: NO GROWTH

## 2012-05-27 ENCOUNTER — Ambulatory Visit: Payer: BC Managed Care – PPO | Attending: Orthopedic Surgery | Admitting: Physical Therapy

## 2012-05-27 DIAGNOSIS — M25619 Stiffness of unspecified shoulder, not elsewhere classified: Secondary | ICD-10-CM | POA: Insufficient documentation

## 2012-05-27 DIAGNOSIS — M25519 Pain in unspecified shoulder: Secondary | ICD-10-CM | POA: Insufficient documentation

## 2012-05-27 DIAGNOSIS — IMO0001 Reserved for inherently not codable concepts without codable children: Secondary | ICD-10-CM | POA: Insufficient documentation

## 2012-05-27 DIAGNOSIS — R5381 Other malaise: Secondary | ICD-10-CM | POA: Insufficient documentation

## 2012-05-30 ENCOUNTER — Ambulatory Visit: Payer: BC Managed Care – PPO | Admitting: Physical Therapy

## 2012-06-11 ENCOUNTER — Ambulatory Visit: Payer: BC Managed Care – PPO | Admitting: Physical Therapy

## 2012-06-13 ENCOUNTER — Encounter: Payer: Medicare Other | Admitting: Physical Therapy

## 2012-09-20 ENCOUNTER — Other Ambulatory Visit: Payer: Self-pay | Admitting: Nurse Practitioner

## 2012-09-20 DIAGNOSIS — R7989 Other specified abnormal findings of blood chemistry: Secondary | ICD-10-CM

## 2012-09-26 ENCOUNTER — Ambulatory Visit
Admission: RE | Admit: 2012-09-26 | Discharge: 2012-09-26 | Disposition: A | Discharge status: Home / Self Care | Payer: BC Managed Care – PPO | Source: Ambulatory Visit | Attending: Nurse Practitioner | Admitting: Nurse Practitioner

## 2012-09-26 DIAGNOSIS — R7989 Other specified abnormal findings of blood chemistry: Secondary | ICD-10-CM

## 2013-03-18 ENCOUNTER — Ambulatory Visit: Payer: BC Managed Care – PPO | Attending: Orthopedic Surgery | Admitting: Physical Therapy

## 2013-03-18 DIAGNOSIS — IMO0001 Reserved for inherently not codable concepts without codable children: Secondary | ICD-10-CM | POA: Insufficient documentation

## 2013-03-18 DIAGNOSIS — R5381 Other malaise: Secondary | ICD-10-CM | POA: Insufficient documentation

## 2013-03-18 DIAGNOSIS — M25519 Pain in unspecified shoulder: Secondary | ICD-10-CM | POA: Insufficient documentation

## 2013-03-18 DIAGNOSIS — M25619 Stiffness of unspecified shoulder, not elsewhere classified: Secondary | ICD-10-CM | POA: Insufficient documentation

## 2013-03-20 ENCOUNTER — Ambulatory Visit: Payer: BC Managed Care – PPO | Attending: Orthopedic Surgery | Admitting: Physical Therapy

## 2013-03-20 DIAGNOSIS — M7552 Bursitis of left shoulder: Secondary | ICD-10-CM

## 2013-03-20 DIAGNOSIS — M25619 Stiffness of unspecified shoulder, not elsewhere classified: Secondary | ICD-10-CM | POA: Insufficient documentation

## 2013-03-20 DIAGNOSIS — IMO0001 Reserved for inherently not codable concepts without codable children: Secondary | ICD-10-CM | POA: Insufficient documentation

## 2013-03-20 DIAGNOSIS — M25519 Pain in unspecified shoulder: Secondary | ICD-10-CM | POA: Insufficient documentation

## 2013-03-20 DIAGNOSIS — R5381 Other malaise: Secondary | ICD-10-CM | POA: Insufficient documentation

## 2013-03-20 HISTORY — DX: Bursitis of left shoulder: M75.52

## 2013-03-25 ENCOUNTER — Ambulatory Visit: Payer: BC Managed Care – PPO | Admitting: Physical Therapy

## 2013-03-27 ENCOUNTER — Ambulatory Visit: Payer: BC Managed Care – PPO | Admitting: Physical Therapy

## 2013-04-01 ENCOUNTER — Ambulatory Visit: Payer: BC Managed Care – PPO | Admitting: Physical Therapy

## 2013-04-03 ENCOUNTER — Ambulatory Visit: Payer: BC Managed Care – PPO | Admitting: Physical Therapy

## 2013-04-08 ENCOUNTER — Encounter: Payer: Medicare Other | Admitting: Physical Therapy

## 2013-04-10 ENCOUNTER — Ambulatory Visit: Payer: BC Managed Care – PPO | Admitting: Physical Therapy

## 2013-04-15 ENCOUNTER — Ambulatory Visit: Payer: BC Managed Care – PPO | Admitting: Physical Therapy

## 2013-04-17 ENCOUNTER — Ambulatory Visit: Payer: BC Managed Care – PPO | Admitting: Physical Therapy

## 2013-04-22 ENCOUNTER — Ambulatory Visit: Payer: BC Managed Care – PPO | Attending: Orthopedic Surgery | Admitting: Physical Therapy

## 2013-04-22 DIAGNOSIS — M25519 Pain in unspecified shoulder: Secondary | ICD-10-CM | POA: Insufficient documentation

## 2013-04-22 DIAGNOSIS — IMO0001 Reserved for inherently not codable concepts without codable children: Secondary | ICD-10-CM | POA: Insufficient documentation

## 2013-04-22 DIAGNOSIS — M25619 Stiffness of unspecified shoulder, not elsewhere classified: Secondary | ICD-10-CM | POA: Insufficient documentation

## 2013-04-22 DIAGNOSIS — R5381 Other malaise: Secondary | ICD-10-CM | POA: Insufficient documentation

## 2013-04-28 ENCOUNTER — Ambulatory Visit: Payer: BC Managed Care – PPO | Admitting: Physical Therapy

## 2013-04-30 ENCOUNTER — Ambulatory Visit: Payer: BC Managed Care – PPO | Admitting: Physical Therapy

## 2013-06-09 ENCOUNTER — Other Ambulatory Visit (INDEPENDENT_AMBULATORY_CARE_PROVIDER_SITE_OTHER): Payer: BC Managed Care – PPO

## 2013-06-09 DIAGNOSIS — Z139 Encounter for screening, unspecified: Secondary | ICD-10-CM

## 2013-06-09 DIAGNOSIS — E785 Hyperlipidemia, unspecified: Secondary | ICD-10-CM

## 2013-06-09 DIAGNOSIS — R7401 Elevation of levels of liver transaminase levels: Secondary | ICD-10-CM

## 2013-06-09 LAB — POCT CBC
HCT, POC: 40.2 % (ref 37.7–47.9)
Hemoglobin: 13.6 g/dL (ref 12.2–16.2)
MCH, POC: 29.5 pg (ref 27–31.2)
MCHC: 33.7 g/dL (ref 31.8–35.4)
POC LYMPH PERCENT: 28.1 %L (ref 10–50)

## 2013-06-09 LAB — THYROID PANEL WITH TSH
T3 Uptake: 36.3 % (ref 22.5–37.0)
T4, Total: 9.2 ug/dL (ref 5.0–12.5)
TSH: 2.166 u[IU]/mL (ref 0.350–4.500)

## 2013-06-09 LAB — COMPLETE METABOLIC PANEL WITH GFR
ALT: 81 U/L — ABNORMAL HIGH (ref 0–35)
AST: 63 U/L — ABNORMAL HIGH (ref 0–37)
Albumin: 4.4 g/dL (ref 3.5–5.2)
Alkaline Phosphatase: 80 U/L (ref 39–117)
BUN: 20 mg/dL (ref 6–23)
Potassium: 3.7 mEq/L (ref 3.5–5.3)

## 2013-06-10 LAB — NMR LIPOPROFILE WITH LIPIDS
HDL Size: 8.6 nm — ABNORMAL LOW (ref >=9.2)
HDL-C: 43 mg/dL (ref >=40)
LDL (calc): 109 mg/dL — ABNORMAL HIGH (ref <100)
LDL Particle Number: 1498 nmol/L — ABNORMAL HIGH (ref <1000)
LP-IR Score: 67 — ABNORMAL HIGH (ref <=45)
Large VLDL-P: 3.4 nmol/L — ABNORMAL HIGH (ref <=2.7)
Triglycerides: 126 mg/dL (ref <150)
VLDL Size: 45.1 nm (ref <=46.6)

## 2013-06-10 LAB — VITAMIN D 25 HYDROXY (VIT D DEFICIENCY, FRACTURES): Vit D, 25-Hydroxy: 22 ng/mL — ABNORMAL LOW (ref 30–89)

## 2013-06-19 ENCOUNTER — Encounter: Payer: Self-pay | Admitting: General Practice

## 2013-06-19 ENCOUNTER — Encounter: Payer: Self-pay | Admitting: Obstetrics and Gynecology

## 2013-06-19 ENCOUNTER — Telehealth: Payer: Self-pay | Admitting: *Deleted

## 2013-06-19 ENCOUNTER — Ambulatory Visit (INDEPENDENT_AMBULATORY_CARE_PROVIDER_SITE_OTHER): Payer: BC Managed Care – PPO | Admitting: General Practice

## 2013-06-19 VITALS — BP 150/77 | HR 75 | Temp 97.0°F | Ht 60.0 in | Wt 138.5 lb

## 2013-06-19 DIAGNOSIS — E785 Hyperlipidemia, unspecified: Secondary | ICD-10-CM

## 2013-06-19 DIAGNOSIS — I10 Essential (primary) hypertension: Secondary | ICD-10-CM

## 2013-06-19 DIAGNOSIS — F411 Generalized anxiety disorder: Secondary | ICD-10-CM

## 2013-06-19 NOTE — Telephone Encounter (Signed)
Pt aware, copy of labs sent to Dr. Willis Modena at fax [phone], [facility].

## 2013-06-19 NOTE — Patient Instructions (Addendum)

## 2013-06-19 NOTE — Progress Notes (Signed)
  Subjective:    Patient ID: Joanna Cohen, female    DOB: 10/01/1946, 67 y.o.   MRN: [id number]  HPI Patient presents today for follow up of chronic health conditions and have labs reviewed. She has a history hyperlipidemia, hypertension, and anxiety. She reports taking medications as directed. She denies eating a healthy diet recently, nor does she have a regular form of exercise. Her labs were reviewed, Vitamin D and LDL discussed. She reports being seen and assessed regularly by PA at her work place for routine health care.    Review of Systems  Constitutional: Negative for fever and chills.  HENT: Negative for neck pain and neck stiffness.   Respiratory: Negative for chest tightness and shortness of breath.   Cardiovascular: Negative for chest pain and palpitations.  Gastrointestinal: Negative for nausea, vomiting, abdominal pain and blood in stool.  Genitourinary: Negative for hematuria, vaginal bleeding and difficulty urinating.  Musculoskeletal: Negative for back pain.  Neurological: Negative for dizziness, weakness and headaches.       Objective:   Physical Exam  Constitutional: She is oriented to person, place, and time. She appears well-developed and well-nourished.  HENT:  Head: Normocephalic and atraumatic.  Right Ear: External ear normal.  Left Ear: External ear normal.  Nose: Nose normal.  Mouth/Throat: Oropharynx is clear and moist.  Eyes: Conjunctivae and EOM are normal. Pupils are equal, round, and reactive to light.  Neck: Normal range of motion. Neck supple. No thyromegaly present.  Cardiovascular: Normal rate, regular rhythm and normal heart sounds.   Pulmonary/Chest: Effort normal and breath sounds normal. No respiratory distress. She exhibits no tenderness.  Abdominal: Soft. Bowel sounds are normal. She exhibits no distension. There is no tenderness.  Lymphadenopathy:    She has no cervical adenopathy.  Neurological: She is alert and oriented to person, place,  and time.  Skin: Skin is warm and dry.  Psychiatric: She has a normal mood and affect.          Assessment & Plan:  1. Other and unspecified hyperlipidemia  2. Essential hypertension, benign  3. GAD (generalized anxiety disorder) -continue current medications -discussed with patient about taking vitamin d3 OTC 1500-2000units daily, recheck in 3 months, if no improvement will order 78295 units for 8 weeks -really work hard on healthy eating habits and exercise -RTO if symptoms develop or seek emergency medical attention -Patient verbalized understanding -Coralie Keens, FNP-C

## 2013-06-20 ENCOUNTER — Ambulatory Visit (INDEPENDENT_AMBULATORY_CARE_PROVIDER_SITE_OTHER): Payer: BC Managed Care – PPO | Admitting: Obstetrics and Gynecology

## 2013-06-20 ENCOUNTER — Encounter: Payer: Self-pay | Admitting: Obstetrics and Gynecology

## 2013-06-20 VITALS — BP 142/70 | HR 76 | Resp 16 | Ht 60.0 in | Wt 138.0 lb

## 2013-06-20 DIAGNOSIS — Z01419 Encounter for gynecological examination (general) (routine) without abnormal findings: Secondary | ICD-10-CM

## 2013-06-20 NOTE — Patient Instructions (Signed)

## 2013-06-20 NOTE — Progress Notes (Signed)
67 y.o.  Divorced    Caucasian   female   G2P2002   here for annual exam.  Has bursitis in left shoulder tx'd with injections   No LMP recorded. Patient is postmenopausal.          Sexually active: no  The current method of family planning is tubal ligation, abstinence and post menopausal status.    Exercising: walking 2-3 days a week Last mammogram: 09/26/2012 normal Last pap smear:05/16/10 neg History of abnormal pap: 1989 CIN 1 Smoking: no Alcohol: no Last colonoscopy: 2011 normal, repeat in 10 years Last Bone Density:  2011  Osteoporosis on tx  Last tetanus shot 01/2012: Last cholesterol check: 05/2013 not resulted yet  Hgb:   pcp             Urine: pcp   Family History  Problem Relation Age of Onset  . Diabetes Mother   . Heart disease Mother   . Liver disease Father     Patient Active Problem List   Diagnosis Date Noted  . Cellulitis of leg 01/18/2012  . HTN (hypertension) 01/18/2012  . Hyperlipidemia 01/18/2012    Past Medical History  Diagnosis Date  . Osteoporosis   . Cellulitis   . History of drainage of abscess 03/29/11    Wound care (rt. hand 4th digit )  . Hypertension   . Hyperlipidemia   . Bursitis     left shoulder (Dr. Juliene Pina)  . Depression   . Anxiety   . AVM (arteriovenous malformation) spine 2000    Right Supra clavicular  . Abnormal Pap smear 1989    Laser Sx, Cryo  . Bursitis of left shoulder 03/2013    Past Surgical History  Procedure Laterality Date  . Other surgical history  40 yrs ago    surgery to repair bone in nose  . Gynecologic cryosurgery    . Cervix lesion destruction  1989    CIN 1  . Tubal ligation    . Nasal sinus surgery  1976    Allergies: Bacitracin-polymyxin b  Current Outpatient Prescriptions  Medication Sig Dispense Refill  . alendronate (FOSAMAX) 70 MG tablet Take 70 mg by mouth every 7 (seven) days. Take with a full glass of water on an empty stomach.      Marland Kitchen amLODipine (NORVASC) 5 MG tablet Take 5 mg by mouth  daily.        . Calcium-Vitamin D (CALTRATE 600 PLUS-VIT D PO) Take by mouth 2 (two) times daily.        . fluticasone (FLONASE) 50 MCG/ACT nasal spray Place 2 sprays into the nose daily.      Marland Kitchen lisinopril-hydrochlorothiazide (PRINZIDE,ZESTORETIC) 20-25 MG per tablet Take 1 tablet by mouth daily.       . Multiple Vitamins-Minerals (CENTRUM SILVER PO) Take 1 tablet by mouth daily.       . Risedronate Sodium (ACTONEL PO) Take by mouth once a week.       . simvastatin (ZOCOR) 40 MG tablet Take 0.5 tablets (20 mg total) by mouth every evening.  30 tablet  0  . venlafaxine (EFFEXOR-XR) 75 MG 24 hr capsule Take 75 mg by mouth daily.      Marland Kitchen aspirin (BAYER ASPIRIN EC LOW DOSE) 81 MG EC tablet Take 81 mg by mouth daily.         No current facility-administered medications for this visit.    ROS: Pertinent items are noted in HPI.  Social JY:NWGNFAOZ, two children, works in Financial trader  service    Exam:    BP 142/70  Pulse 76  Resp 16  Ht 5' (1.524 m)  Wt 138 lb (62.596 kg)  BMI 26.95 kg/m2  Ht stable, wt up 3 pounds from last year Wt Readings from Last 3 Encounters:  06/20/13 138 lb (62.596 kg)  06/19/13 138 lb 8 oz (62.823 kg)  01/18/12 132 lb 9.6 oz (60.147 kg)     Ht Readings from Last 3 Encounters:  06/20/13 5' (1.524 m)  06/19/13 5' (1.524 m)  01/18/12 5' (1.524 m)    General appearance: alert, cooperative and appears stated age Head: Normocephalic, without obvious abnormality, atraumatic Neck: no adenopathy, supple, symmetrical, trachea midline and thyroid not enlarged, symmetric, no tenderness/mass/nodules Lungs: clear to auscultation bilaterally Breasts: Inspection negative, No nipple retraction or dimpling, No nipple discharge or bleeding, No axillary or supraclavicular adenopathy, Normal to palpation without dominant masses Heart: regular rate and rhythm Abdomen: soft, non-tender; bowel sounds normal; no masses,  no organomegaly Extremities: extremities normal, atraumatic, no  cyanosis or edema Skin: Skin color, texture, turgor normal. No rashes or lesions Lymph nodes: Cervical, supraclavicular, and axillary nodes normal. No abnormal inguinal nodes palpated Neurologic: Grossly normal   Pelvic: External genitalia:  no lesions              Urethra:  normal appearing urethra with no masses, tenderness or lesions              Bartholins and Skenes: normal                 Vagina: normal appearing vagina with normal color and discharge, no lesions              Cervix: normal appearance              Pap taken: no        Bimanual Exam:  Uterus:  uterus is normal size, shape, consistency and nontender, mid, mobile                                      Adnexa: normal adnexa in size, nontender and no masses                                      Rectovaginal: Confirms                                      Anus:  normal sphincter tone, no lesions  A: normal menopausal exam, no HRT     Osteoporosis, on actonel by PCP     Right supraclavicular AV malformation     Depression/anxiety     HTN     P:     mammogram counseled on breast self exam, mammography screening, adequate intake of calcium and vitamin D, diet and exercise return annually or prn     An After Visit Summary was printed and given to the patient.

## 2013-08-29 ENCOUNTER — Other Ambulatory Visit: Payer: Self-pay | Admitting: *Deleted

## 2013-08-29 MED ORDER — VENLAFAXINE HCL ER 75 MG PO CP24: 75.0000 mg | ORAL_CAPSULE | Freq: Every day | ORAL | Status: DC

## 2013-09-02 ENCOUNTER — Encounter: Payer: Self-pay | Admitting: Gynecology

## 2014-02-02 ENCOUNTER — Other Ambulatory Visit: Payer: Self-pay | Admitting: General Practice

## 2014-02-03 NOTE — Telephone Encounter (Signed)
Last seen 06/19/13  Mae  Requesting 90 day supply for mail order

## 2014-05-08 ENCOUNTER — Ambulatory Visit: Payer: BC Managed Care – PPO | Admitting: Nurse Practitioner

## 2014-05-11 ENCOUNTER — Encounter: Payer: Self-pay | Admitting: Family Medicine

## 2014-05-11 ENCOUNTER — Ambulatory Visit (INDEPENDENT_AMBULATORY_CARE_PROVIDER_SITE_OTHER): Payer: BC Managed Care – PPO | Admitting: Family Medicine

## 2014-05-11 VITALS — BP 145/74 | HR 78 | Temp 98.1°F | Ht 60.0 in | Wt 142.0 lb

## 2014-05-11 DIAGNOSIS — N390 Urinary tract infection, site not specified: Secondary | ICD-10-CM

## 2014-05-11 LAB — POCT URINALYSIS DIPSTICK
Bilirubin, UA: NEGATIVE
Blood, UA: NEGATIVE
Glucose, UA: NEGATIVE
Ketones, UA: NEGATIVE
Nitrite, UA: NEGATIVE
Protein, UA: NEGATIVE
Spec Grav, UA: 1.01
Urobilinogen, UA: NEGATIVE
pH, UA: 6

## 2014-05-11 LAB — POCT UA - MICROSCOPIC ONLY
Bacteria, U Microscopic: NEGATIVE
Casts, Ur, LPF, POC: NEGATIVE
Crystals, Ur, HPF, POC: NEGATIVE
Mucus, UA: NEGATIVE
RBC, urine, microscopic: NEGATIVE

## 2014-05-11 MED ORDER — CIPROFLOXACIN HCL 500 MG PO TABS: 500.0000 mg | ORAL_TABLET | Freq: Two times a day (BID) | ORAL | Status: DC

## 2014-05-11 NOTE — Progress Notes (Signed)
   Subjective:    Patient ID: Trey Sailors, female    DOB: [birth year], 68 y.o.   MRN: [id number]  HPI This 68 y.o. female presents for evaluation of dysuria for 3 days.   Review of Systems C/o dysuria   No chest pain, SOB, HA, dizziness, vision change, N/V, diarrhea, constipation, dysuria, urinary urgency or frequency, myalgias, arthralgias or rash.  Objective:   Physical Exam  Vital signs noted  Well developed well nourished female.  HEENT - Head atraumatic Normocephalic                Eyes - PERRLA, Conjuctiva - clear Sclera- Clear EOMI                Ears - EAC's Wnl TM's Wnl Gross Hearing WNL                 Throat - oropharanx wnl Respiratory - Lungs CTA bilateral Cardiac - RRR S1 and S2 without murmur GI - Abdomen soft Nontender and bowel sounds active x 4  Results for orders placed in visit on 05/11/14  POCT URINALYSIS DIPSTICK      Result Value Ref Range   Color, UA yellow     Clarity, UA cloudy     Glucose, UA neg     Bilirubin, UA neg     Ketones, UA neg     Spec Grav, UA 1.010     Blood, UA neg     pH, UA 6.0     Protein, UA neg     Urobilinogen, UA negative     Nitrite, UA neg     Leukocytes, UA moderate (2+)    POCT UA - MICROSCOPIC ONLY      Result Value Ref Range   WBC, Ur, HPF, POC 20-25     RBC, urine, microscopic neg     Bacteria, U Microscopic neg     Mucus, UA neg     Epithelial cells, urine per micros occ     Crystals, Ur, HPF, POC neg     Casts, Ur, LPF, POC neg     Yeast, UA occ        Assessment & Plan:  Urinary tract infection, site not specified - Plan: POCT urinalysis dipstick, POCT UA - Microscopic Only, Urine culture, ciprofloxacin (CIPRO) 500 MG tablet  Push po fluids, rest, tylenol and motrin otc prn as directed for fever, arthralgias, and myalgias.  Follow up prn if sx's continue or persist.  Lysbeth Penner FNP

## 2014-05-14 LAB — URINE CULTURE

## 2014-05-28 ENCOUNTER — Ambulatory Visit (INDEPENDENT_AMBULATORY_CARE_PROVIDER_SITE_OTHER): Payer: BC Managed Care – PPO | Admitting: Nurse Practitioner

## 2014-05-28 ENCOUNTER — Telehealth: Payer: Self-pay | Admitting: General Practice

## 2014-05-28 ENCOUNTER — Encounter: Payer: Self-pay | Admitting: Nurse Practitioner

## 2014-05-28 VITALS — BP 154/80 | HR 103 | Temp 102.7°F | Ht 60.0 in | Wt 141.6 lb

## 2014-05-28 DIAGNOSIS — J029 Acute pharyngitis, unspecified: Secondary | ICD-10-CM

## 2014-05-28 DIAGNOSIS — J069 Acute upper respiratory infection, unspecified: Secondary | ICD-10-CM

## 2014-05-28 LAB — POCT INFLUENZA A/B
Influenza A, POC: NEGATIVE
Influenza B, POC: NEGATIVE

## 2014-05-28 LAB — POCT RAPID STREP A (OFFICE): RAPID STREP A SCREEN: NEGATIVE

## 2014-05-28 MED ORDER — AZITHROMYCIN 250 MG PO TABS: ORAL_TABLET | ORAL | Status: DC

## 2014-05-28 NOTE — Progress Notes (Signed)
   Subjective:    Patient ID: Joanna Cohen, female    DOB: [birth year], 68 y.o.   MRN: [id number]  Sore Throat  This is a new problem. The current episode started yesterday. The problem has been unchanged. The maximum temperature recorded prior to her arrival was 102 - 102.9 F. The fever has been present for less than 1 day. The pain is at a severity of 3/10 (headache. ). Associated symptoms include headaches. She has had exposure to strep. Treatments tried: OTC cough medicine.  The treatment provided no relief.      Review of Systems  Constitutional: Negative.   Respiratory: Negative.   Cardiovascular: Negative.   Gastrointestinal: Negative.   Endocrine: Negative.   Genitourinary: Negative.   Skin: Negative.   Allergic/Immunologic: Negative.   Neurological: Positive for headaches.  Hematological: Negative.        Objective:   Physical Exam  Constitutional: She is oriented to person, place, and time. She appears well-developed and well-nourished.  HENT:  Head: Normocephalic.  Eyes: Pupils are equal, round, and reactive to light.  Neck: Normal range of motion.  Cardiovascular: Normal rate.   Pulmonary/Chest: Effort normal.  Abdominal: Soft.  Musculoskeletal: Normal range of motion.  Neurological: She is alert and oriented to person, place, and time.    BP 154/80  Pulse 103  Temp(Src) 102.7 F (39.3 C) (Oral)  Ht 5' (1.524 m)  Wt 141 lb 9.6 oz (64.229 kg)  BMI 27.65 kg/m2   Results for orders placed in visit on 05/28/14  POCT INFLUENZA A/B      Result Value Ref Range   Influenza A, POC Negative     Influenza B, POC Negative    POCT RAPID STREP A (OFFICE)      Result Value Ref Range   Rapid Strep A Screen Negative  Negative       Assessment & Plan:   1. Sore throat   2. Acute upper respiratory infections of unspecified site    Meds ordered this encounter  Medications  . azithromycin (ZITHROMAX) 250 MG tablet    Sig: As directed.    Dispense:  6 tablet   Refill:  0    Order Specific Question:  Supervising Provider    Answer:  Chipper Herb [[id number]]  1. Take meds as prescribed 2. Use a cool mist humidifier especially during the winter months and when heat has been humid. 3. Use saline nose sprays frequently 4. Saline irrigations of the nose can be very helpful if done frequently.  * 4X daily for 1 week*  * Use of a nettie pot can be helpful with this. Follow directions with this* 5. Drink plenty of fluids 6. Keep thermostat turn down low 7.For any cough or congestion  Use plain Mucinex- regular strength or max strength is fine   * Children- consult with Pharmacist for dosing 8. For fever or aces or pains- take tylenol or ibuprofen appropriate for age and weight.  * for fevers greater than 101 orally you may alternate ibuprofen and tylenol every  3 hours.   Mary-Margaret Hassell Done, FNP

## 2014-05-28 NOTE — Patient Instructions (Signed)

## 2014-05-28 NOTE — Telephone Encounter (Signed)
appt scheduled for 5:30 with mmm

## 2014-07-02 ENCOUNTER — Ambulatory Visit: Payer: BC Managed Care – PPO | Admitting: Gynecology

## 2014-07-03 ENCOUNTER — Ambulatory Visit: Payer: BC Managed Care – PPO | Admitting: Gynecology

## 2014-07-20 ENCOUNTER — Encounter: Payer: Self-pay | Admitting: Gynecology

## 2014-07-20 ENCOUNTER — Ambulatory Visit (INDEPENDENT_AMBULATORY_CARE_PROVIDER_SITE_OTHER): Payer: BC Managed Care – PPO | Admitting: Gynecology

## 2014-07-20 VITALS — BP 120/60 | HR 80 | Resp 12 | Ht 60.0 in | Wt 149.0 lb

## 2014-07-20 DIAGNOSIS — N760 Acute vaginitis: Secondary | ICD-10-CM

## 2014-07-20 DIAGNOSIS — Z01419 Encounter for gynecological examination (general) (routine) without abnormal findings: Secondary | ICD-10-CM

## 2014-07-20 DIAGNOSIS — N762 Acute vulvitis: Secondary | ICD-10-CM

## 2014-07-20 DIAGNOSIS — Z0000 Encounter for general adult medical examination without abnormal findings: Secondary | ICD-10-CM

## 2014-07-20 LAB — POCT URINALYSIS DIPSTICK
LEUKOCYTES UA: NEGATIVE
PH UA: 5
Urobilinogen, UA: NEGATIVE

## 2014-07-20 MED ORDER — TRIAMCINOLONE ACETONIDE 0.5 % EX OINT: 1.0000 "application " | TOPICAL_OINTMENT | Freq: Two times a day (BID) | CUTANEOUS | Status: DC

## 2014-07-20 NOTE — Patient Instructions (Signed)
Apply steriod with q tip to affected area, if not better in 1w, pls call Use cocoanut oil as vulvar lubricant avoid vagisil Try aveeno bath and pour over labia

## 2014-07-20 NOTE — Progress Notes (Signed)
68 y.o. Single Caucasian female   [id number] here for annual exam. Pt reports menses are absent due to Menopause. She does not report hot flashes, does not have night sweats, does have vaginal dryness.  She is not using lubricants.  She does not report post-menopasual bleeding.  Pt has osteoporosis and was switched to fosamax from actonel due to cost but now has acid reflux that needs treatment. Pt reports several month history of vulvitis, same site, no discharge, bothers only at night.  Not sexually active  No LMP recorded. Patient is postmenopausal.          Sexually active: No.  The current method of family planning is post menopausal status.    Exercising: No.  The patient does not participate in regular exercise at present. Last pap: 05/16/10 Negative  Abnormal PAP: yes years ago  Mammogram: 10/02/13 Bi-Rads 2 BSE: yes Colonoscopy: 2011 Normal f/u in 10 years DEXA:  08/2013 Alcohol: no Tobacco: no  Labs: Work ; Urine: Leuks 2   Health Maintenance  Topic Date Due  . Zostavax  06/28/2006  . Pneumococcal Polysaccharide Vaccine Age 40 And Over  06/29/2011  . Influenza Vaccine  06/20/2014  . Mammogram  10/03/2015  . Colonoscopy  11/21/2019  . Tetanus/tdap  01/17/2022    Family History  Problem Relation Age of Onset  . Diabetes Mother   . Heart disease Mother   . Liver disease Father     Patient Active Problem List   Diagnosis Date Noted  . Cellulitis of leg 01/18/2012  . HTN (hypertension) 01/18/2012  . Hyperlipidemia 01/18/2012    Past Medical History  Diagnosis Date  . Osteoporosis   . Cellulitis   . History of drainage of abscess 03/29/11    Wound care (rt. hand 4th digit )  . Hypertension   . Hyperlipidemia   . Bursitis     left shoulder (Dr. Cay Schillings)  . Depression   . Anxiety   . AVM (arteriovenous malformation) spine 2000    Right Supra clavicular  . Abnormal Pap smear 1989    Laser Sx, Cryo  . Bursitis of left shoulder 03/2013    Past Surgical History   Procedure Laterality Date  . Other surgical history  40 yrs ago    surgery to repair bone in nose  . Gynecologic cryosurgery    . Cervix lesion destruction  1989    CIN 1  . Tubal ligation    . Nasal sinus surgery  1976    Allergies: Bacitracin-polymyxin b  Current Outpatient Prescriptions  Medication Sig Dispense Refill  . alendronate (FOSAMAX) 70 MG tablet Take 70 mg by mouth every 7 (seven) days. Take with a full glass of water on an empty stomach.      Marland Kitchen amLODipine (NORVASC) 5 MG tablet Take 5 mg by mouth daily.        Marland Kitchen aspirin (BAYER ASPIRIN EC LOW DOSE) 81 MG EC tablet Take 81 mg by mouth daily.        Marland Kitchen azithromycin (ZITHROMAX) 250 MG tablet As directed.  6 tablet  0  . Calcium-Vitamin D (CALTRATE 600 PLUS-VIT D PO) Take by mouth 2 (two) times daily.        . fluticasone (FLONASE) 50 MCG/ACT nasal spray Place 2 sprays into the nose daily.      Marland Kitchen lisinopril-hydrochlorothiazide (PRINZIDE,ZESTORETIC) 20-25 MG per tablet Take 1 tablet by mouth daily.       . Multiple Vitamins-Minerals (CENTRUM SILVER PO) Take 1 tablet  by mouth daily.       . Risedronate Sodium (ACTONEL PO) Take by mouth once a week.       . simvastatin (ZOCOR) 40 MG tablet Take 0.5 tablets (20 mg total) by mouth every evening.  30 tablet  0  . venlafaxine XR (EFFEXOR-XR) 75 MG 24 hr capsule TAKE 1 CAPSULE DAILY  90 capsule  0   No current facility-administered medications for this visit.    ROS: Pertinent items are noted in HPI.  Exam:    There were no vitals taken for this visit. Weight change: @WEIGHTCHANGE @ Last 3 height recordings:  Ht Readings from Last 3 Encounters:  05/28/14 5' (1.524 m)  05/11/14 5' (1.524 m)  06/20/13 5' (1.524 m)   General appearance: alert, cooperative and appears stated age Head: Normocephalic, without obvious abnormality, atraumatic Neck: no adenopathy, no carotid bruit, no JVD, supple, symmetrical, trachea midline and thyroid not enlarged, symmetric, no  tenderness/mass/nodules Lungs: clear to auscultation bilaterally Breasts: Inspection negative, No nipple retraction or dimpling, No nipple discharge or bleeding, No axillary or supraclavicular adenopathy Heart: regular rate and rhythm, S1, S2 normal, no murmur, click, rub or gallop Abdomen: soft, non-tender; bowel sounds normal; no masses,  no organomegaly Extremities: extremities normal, atraumatic, no cyanosis or edema Skin: Skin color, texture, turgor normal. No rashes or lesions Lymph nodes: Cervical, supraclavicular, and axillary nodes normal. no inguinal nodes palpated Neurologic: Grossly normal   Pelvic: External genitalia:  Mild excoriation at crux of vulva, no thickening or leukoplakia              Urethra: normal appearing urethra with no masses, tenderness or lesions              Bartholins and Skenes: normal                 Vagina: atrophic              Cervix: normal appearance              Pap taken: No.        Bimanual Exam:  Uterus:  uterus is normal size, shape, consistency and nontender                                      Adnexa:    no masses                                      Rectovaginal: Confirms                                      Anus:  normal sphincter tone, no lesions       1. Routine gynecological examination counseled on breast self exam, mammography screening, osteoporosis, adequate intake of calcium and vitamin D return annually or prn Discussed PAP guideline changes, importance of weight bearing exercises, calcium, vit D and balanced diet.   2. Laboratory examination ordered as part of a routine general medical examination Labs at PCP - POCT Urinalysis Dipstick  3. Vulvitis Kenalog ointment 0.05% Apply steriod with q tip to affected area, if not better in 1w, pls call Use cocoanut oil as vulvar lubricant avoid vagisil Try aveeno bath and pour over labia   An  After Visit Summary was printed and given to the patient.

## 2014-09-21 ENCOUNTER — Encounter: Payer: Self-pay | Admitting: Gynecology

## 2014-10-21 ENCOUNTER — Telehealth: Payer: Self-pay

## 2014-10-21 NOTE — Telephone Encounter (Signed)
lmtcb to reschedule AEX with Dr. Lathrop 

## 2015-07-23 ENCOUNTER — Ambulatory Visit (INDEPENDENT_AMBULATORY_CARE_PROVIDER_SITE_OTHER): Payer: BLUE CROSS/BLUE SHIELD | Admitting: Nurse Practitioner

## 2015-07-23 ENCOUNTER — Encounter: Payer: Self-pay | Admitting: Nurse Practitioner

## 2015-07-23 VITALS — BP 112/72 | HR 64 | Ht 60.0 in | Wt 135.0 lb

## 2015-07-23 DIAGNOSIS — Z01419 Encounter for gynecological examination (general) (routine) without abnormal findings: Secondary | ICD-10-CM | POA: Diagnosis not present

## 2015-07-23 DIAGNOSIS — N9089 Other specified noninflammatory disorders of vulva and perineum: Secondary | ICD-10-CM

## 2015-07-23 DIAGNOSIS — I10 Essential (primary) hypertension: Secondary | ICD-10-CM

## 2015-07-23 DIAGNOSIS — Z0000 Encounter for general adult medical examination without abnormal findings: Secondary | ICD-10-CM | POA: Diagnosis not present

## 2015-07-23 NOTE — Patient Instructions (Addendum)
EXERCISE AND DIET:  We recommended that you start or continue a regular exercise program for good health. Regular exercise means any activity that makes your heart beat faster and makes you sweat.  We recommend exercising at least 30 minutes per day at least 3 days a week, preferably 4 or 5.  We also recommend a diet low in fat and sugar.  Inactivity, poor dietary choices and obesity can cause diabetes, heart attack, stroke, and kidney damage, among others.    ALCOHOL AND SMOKING:  Women should limit their alcohol intake to no more than 7 drinks/beers/glasses of wine (combined, not each!) per week. Moderation of alcohol intake to this level decreases your risk of breast cancer and liver damage. And of course, no recreational drugs are part of a healthy lifestyle.  And absolutely no smoking or even second hand smoke. Most people know smoking can cause heart and lung diseases, but did you know it also contributes to weakening of your bones? Aging of your skin?  Yellowing of your teeth and nails?  CALCIUM AND VITAMIN D:  Adequate intake of calcium and Vitamin D are recommended.  The recommendations for exact amounts of these supplements seem to change often, but generally speaking 600 mg of calcium (either carbonate or citrate) and 800 units of Vitamin D per day seems prudent. Certain women may benefit from higher intake of Vitamin D.  If you are among these women, your doctor will have told you during your visit.    PAP SMEARS:  Pap smears, to check for cervical cancer or precancers,  have traditionally been done yearly, although recent scientific advances have shown that most women can have pap smears less often.  However, every woman still should have a physical exam from her gynecologist every year. It will include a breast check, inspection of the vulva and vagina to check for abnormal growths or skin changes, a visual exam of the cervix, and then an exam to evaluate the size and shape of the uterus and  ovaries.  And after 69 years of age, a rectal exam is indicated to check for rectal cancers. We will also provide age appropriate advice regarding health maintenance, like when you should have certain vaccines, screening for sexually transmitted diseases, bone density testing, colonoscopy, mammograms, etc.   MAMMOGRAMS:  All women over 40 years old should have a yearly mammogram. Many facilities now offer a "3D" mammogram, which may cost around $50 extra out of pocket. If possible,  we recommend you accept the option to have the 3D mammogram performed.  It both reduces the number of women who will be called back for extra views which then turn out to be normal, and it is better than the routine mammogram at detecting truly abnormal areas.    COLONOSCOPY:  Colonoscopy to screen for colon cancer is recommended for all women at age 50.  We know, you hate the idea of the prep.  We agree, BUT, having colon cancer and not knowing it is worse!!  Colon cancer so often starts as a polyp that can be seen and removed at colonscopy, which can quite literally save your life!  And if your first colonoscopy is normal and you have no family history of colon cancer, most women don't have to have it again for 10 years.  Once every ten years, you can do something that may end up saving your life, right?  We will be happy to help you get it scheduled when you are ready.    Be sure to check your insurance coverage so you understand how much it will cost.  It may be covered as a preventative service at no cost, but you should check your particular policy.      Call [facility] and ask about date for Prevnar 13  ? - Shingles vaccine Find out how long on Actonel - then changed to Fosamax--- start date Also find out about last bone density  Lichen Sclerosus and Atrophy Lichen sclerosus is a skin problem. It can happen on any part of the body, but it commonly involves the anal or genital areas. Lichen sclerosus is not an  infection or a fungus. Girls and women are more commonly affected than boys and men. CAUSES The cause is not known. It could be the result of an overactive immune system or a lack of certain hormones. Lichen sclerosus is not passed from one person to another (not contagious). SYMPTOMS Your skin may have:  Thin, wrinkled, Gehling areas.  Thickened Beckles areas.  Red and swollen patches.  Tears or cracks.  Bruising.  Blood blisters.  Severe itching. You may also have pain, itching, or burning with urination. Constipation is also common in people with lichen sclerosus. DIAGNOSIS Your caregiver will do a physical exam. Sometimes, a tissue sample (biopsy) may be sent for testing. TREATMENT Treatment may involve putting a thin layer of medicated cream (topical steroid) over the areas with lichen sclerosus. Use the cream only as directed by your caregiver.  HOME CARE INSTRUCTIONS  Only take over-the-counter or prescription medicines as directed by your caregiver.  Keep the vaginal area as clean and dry as possible. SEEK MEDICAL CARE IF: You develop increasing pain, swelling, or redness. Document Released: 03/29/2011 Document Revised: 01/29/2012 Document Reviewed: 03/29/2011 [facility] Patient Information 2015 [place], [place]. This information is not intended to replace advice given to you by your health care provider. Make sure you discuss any questions you have with your health care provider.

## 2015-07-23 NOTE — Progress Notes (Signed)
Patient ID: Joanna Cohen, female   DOB: 1946/03/03, 69 y.o.   MRN: [id number] 69 y.o. G2P2002 Divorced Caucasian Fe here for annual exam. No complaints of vaso symptoms.  Not SA for 30 years.  Sister age 25 passed away 2 weeks ago from diabetes, renal and vascular complications. A few weeks ago while at the hospital she had an incident trying to get the chair to lay back while caring for her sister.  She then flipped off her shoe and went to retrieve it hitting the right breast.  No bruising but continues to be sore with movement of right arm or upper chest wall.  Patient's last menstrual period was 11/20/2001 (approximate).          Sexually active: No.  The current method of family planning is abstinence.    Exercising: No.  The patient does not participate in regular exercise at present.  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   Smoker:  no  Health Maintenance: Pap:  05/16/10, Negative  MMG:  10/05/14, 3D, Bi-Rads 2:  Benign findings Colonoscopy:  07/22/10, Normal, repeat in 10 years will get IFOB from PCP in 12/16 BMD:  2011 at [place]; has appointment in November 2016 for repeat BMD: done about 5 years TDaP:  01/18/12 Shingles: 2015 Labs:  PCP   reports that she has never smoked. She has never used smokeless tobacco. She reports that she does not drink alcohol or use illicit drugs.  Past Medical History  Diagnosis Date  . Osteoporosis   . Cellulitis   . History of drainage of abscess 03/29/11    Wound care (rt. hand 4th digit )  . Hypertension   . Hyperlipidemia   . Bursitis     left shoulder (Dr. Cay Schillings)  . Depression   . Anxiety   . AVM (arteriovenous malformation) spine 2000    Right Supra clavicular  . Abnormal Pap smear 1989     Cryo for abnormal pap and normal since  . Bursitis of left shoulder 03/2013    Past Surgical History  Procedure Laterality Date  . Cervix lesion destruction  1989    CIN 1  . Tubal  ligation    . Nasal sinus surgery  1976    also repair of nasal bone  . Gynecologic cryosurgery  1989    Current Outpatient Prescriptions  Medication Sig Dispense Refill  . alendronate (FOSAMAX) 70 MG tablet Take 70 mg by mouth every 7 (seven) days. Take with a full glass of water on an empty stomach.    Marland Kitchen amLODipine (NORVASC) 5 MG tablet Take 5 mg by mouth daily.      . fluticasone (FLONASE) 50 MCG/ACT nasal spray Place 2 sprays into the nose daily.    Marland Kitchen lisinopril-hydrochlorothiazide (PRINZIDE,ZESTORETIC) 20-12.5 MG per tablet Take 1 tablet by mouth daily.    . simvastatin (ZOCOR) 40 MG tablet Take 0.5 tablets (20 mg total) by mouth every evening. 30 tablet 0  . venlafaxine XR (EFFEXOR-XR) 75 MG 24 hr capsule TAKE 1 CAPSULE DAILY 90 capsule 0   No current facility-administered medications for this visit.    Family History  Problem Relation Age of Onset  . Diabetes Mother   . Heart disease Mother   . Hypertension Mother   . Heart failure Mother   . Liver disease Father   . Diabetes Father   . Diabetes Sister   . Hypertension Sister   . Breast cancer Maternal  Aunt     ROS:  Pertinent items are noted in HPI.  Otherwise, a comprehensive ROS was negative.  Exam:   BP 112/72 mmHg  Pulse 64  Ht 5' (1.524 m)  Wt 135 lb (61.236 kg)  BMI 26.37 kg/m2  LMP 11/20/2001 (Approximate) Height: 5' (152.4 cm) Ht Readings from Last 3 Encounters:  07/23/15 5' (1.524 m)  07/20/14 5' (1.524 m)  05/28/14 5' (1.524 m)    General appearance: alert, cooperative and appears stated age Head: Normocephalic, without obvious abnormality, atraumatic Neck: no adenopathy, supple, symmetrical, trachea midline and thyroid normal to inspection and palpation Lungs: clear to auscultation bilaterally Breasts: Inspection negative, No nipple retraction or dimpling, No axillary or supraclavicular adenopathy, Normal to palpation without dominant masses, positive findings: tenderness along the right rib area  under the breast. Heart: regular rate and rhythm Abdomen: soft, non-tender; no masses,  no organomegaly Extremities: extremities normal, atraumatic, no cyanosis or edema Skin: Skin color, texture, turgor normal. No rashes or lesions Lymph nodes: Cervical, supraclavicular, and axillary nodes normal. There is an old injury to right trapezius that has left a soft palpable mass about 3 X 3 inches.  Has been evaluated several times by PCP and diagnosed as AV malformation No abnormal inguinal nodes palpated Neurologic: Grossly normal   Pelvic: External genitalia:  Left labia with a slight raised Cancel hypopigmented lesion, slight pigmented changes on the left. Per pt. area with slight itching at times.              Urethra:  normal appearing urethra with no masses, tenderness or lesions              Bartholin's and Skene's: normal                 Vagina: normal appearing vagina with normal color and discharge, no lesions              Cervix: anteverted              Pap taken: Yes.   Bimanual Exam:  Uterus:  normal size, contour, position, consistency, mobility, non-tender              Adnexa: no mass, fullness, tenderness               Rectovaginal: Confirms               Anus:  normal sphincter tone, no lesions  Chaperone present: yes  A:  Well Woman with normal exam  Postmenopausal   ? LSA left vulva  Injury to right chest wall - most likely costochondralis  History of right AV malformation right supraclavicular 2000  History of anxiety and depression  History of HTN, hypercholesterolemia  History of osteoporosis and on Fosamax for 5+ years  Grief reaction - sister passed 2 weeks ago    P:   Reviewed health and wellness pertinent to exam  Pap smear as above  Mammogram is due 09/2015  She will return here for vulvar biopsy to R/O LSA  She plans on calling prior PCP and verifying immunizations.  She will also check on date of starting Actonel initially then changed to Fosamax. She  plans to have new PCP - Dr. Allie Bossier to repeat BMD and make recommendations.   Counseled on breast self exam, mammography screening, adequate intake of calcium and vitamin D, diet and exercise return annually or prn  An After Visit Summary was printed and given to the patient.

## 2015-07-25 NOTE — Progress Notes (Signed)
Encounter reviewed by Dr. Brook Amundson C. Silva.  

## 2015-07-27 LAB — IPS PAP SMEAR ONLY

## 2015-07-28 ENCOUNTER — Ambulatory Visit: Payer: BC Managed Care – PPO | Admitting: Gynecology

## 2015-08-11 ENCOUNTER — Telehealth: Payer: Self-pay | Admitting: Obstetrics and Gynecology

## 2015-08-11 NOTE — Telephone Encounter (Signed)
Spoke with patient. Reviewed benefit for procedure. Patient understood and agreeable. Scheduled patient with Dr Quincy Simmonds on 09/01/15. Reviewed 72 hour cancellation policy with $160 fee. Patient understood and agreeable. Verified arrival date/time for procedure. Patient agreeable. Ok to close.

## 2015-09-01 ENCOUNTER — Ambulatory Visit (INDEPENDENT_AMBULATORY_CARE_PROVIDER_SITE_OTHER): Payer: BLUE CROSS/BLUE SHIELD | Admitting: Obstetrics and Gynecology

## 2015-09-01 ENCOUNTER — Encounter: Payer: Self-pay | Admitting: Obstetrics and Gynecology

## 2015-09-01 DIAGNOSIS — N9089 Other specified noninflammatory disorders of vulva and perineum: Secondary | ICD-10-CM | POA: Diagnosis not present

## 2015-09-01 NOTE — Patient Instructions (Signed)

## 2015-09-01 NOTE — Progress Notes (Signed)
Subjective:     Patient ID: Joanna Cohen, female   DOB: May 11, 1946, 69 y.o.   MRN: [id number]  HPI  Patient here for vulvar biopsy today due to raised Langner hypopigmented lesion on left labia with slight itching. More bothersome at night.  This occurring for over one year.  No burning, bleeding, or ulceration.  Becomes more irritated when scratches it.  Has used Kenalog ointment 0.05% and this helped a little in the past.  Not using this regularly.   Hypopigmented and raised area noted an last annual exam with [address].  Had normal pap on 07/23/15.  Review of Systems  LMP: 2003 Contraception: Postmenopausal       Objective:   Physical Exam  Genitourinary:      Biopsy of left labia majora.  Consent for procedure.  Sterile prep with betadine.  Local 1% lidocaine - lot [id number], exp 11/21/15.  3 mm punch biopsy to left labia majora 6 - 7 mm raised Idrovo lesion.  Tissue to pathology.  AgNO3 placed.  Good hemostasis.  No complications.  Minimal EBL.     Assessment:     Left vulvar lesion.  Pruritis.     Plan:     Follow up biopsy.  Instructions and precautions given.  Recommendations to follow.   After visit summary to patient.

## 2015-09-03 LAB — IPS OTHER TISSUE BIOPSY

## 2015-09-07 ENCOUNTER — Telehealth: Payer: Self-pay | Admitting: Emergency Medicine

## 2015-09-07 NOTE — Telephone Encounter (Signed)
-----   Message from Nunzio Cobbs, MD sent at 09/04/2015 12:56 PM EDT ----- Please report vulvar biopsy showing nonspecific dermatitis to patient.  This is showing chronic inflammation.  There is no sign of lichen sclerosus or malignancy.  I recommend patient use her Kenalog twice a day for two weeks, and then reduce the usage to twice a week as needed to control itching.  Avoid irritants on her bottom.  Return for increased symptoms.   Cc- Patty Gabriela Eves

## 2015-09-07 NOTE — Telephone Encounter (Signed)
Called patient and message from Dr. Quincy Simmonds given.  She will follow instructions as given and then will follow up as needed or if any continued or increased symptoms.  Routing to provider for final review. Patient agreeable to disposition. Will close encounter.

## 2015-10-29 ENCOUNTER — Telehealth: Payer: Self-pay | Admitting: Nurse Practitioner

## 2015-10-29 DIAGNOSIS — R7989 Other specified abnormal findings of blood chemistry: Secondary | ICD-10-CM

## 2015-10-29 HISTORY — DX: Other specified abnormal findings of blood chemistry: R79.89

## 2015-10-29 NOTE — Telephone Encounter (Signed)
Stp and advised she had shingles shot in May of 2013 and Pneumonia in October of 2012.

## 2015-11-09 ENCOUNTER — Encounter: Payer: Self-pay | Admitting: Obstetrics and Gynecology

## 2016-02-21 DIAGNOSIS — H6981 Other specified disorders of Eustachian tube, right ear: Secondary | ICD-10-CM | POA: Diagnosis not present

## 2016-05-03 DIAGNOSIS — M5441 Lumbago with sciatica, right side: Secondary | ICD-10-CM | POA: Diagnosis not present

## 2016-05-03 DIAGNOSIS — M5442 Lumbago with sciatica, left side: Secondary | ICD-10-CM | POA: Diagnosis not present

## 2016-07-26 DIAGNOSIS — E782 Mixed hyperlipidemia: Secondary | ICD-10-CM | POA: Diagnosis not present

## 2016-07-26 DIAGNOSIS — J309 Allergic rhinitis, unspecified: Secondary | ICD-10-CM | POA: Diagnosis not present

## 2016-07-26 DIAGNOSIS — E559 Vitamin D deficiency, unspecified: Secondary | ICD-10-CM | POA: Diagnosis not present

## 2016-07-26 DIAGNOSIS — R740 Nonspecific elevation of levels of transaminase and lactic acid dehydrogenase [LDH]: Secondary | ICD-10-CM | POA: Diagnosis not present

## 2016-07-28 ENCOUNTER — Ambulatory Visit: Payer: BLUE CROSS/BLUE SHIELD | Admitting: Nurse Practitioner

## 2016-08-07 ENCOUNTER — Encounter: Payer: Self-pay | Admitting: Nurse Practitioner

## 2016-08-07 ENCOUNTER — Ambulatory Visit (INDEPENDENT_AMBULATORY_CARE_PROVIDER_SITE_OTHER): Payer: BLUE CROSS/BLUE SHIELD | Admitting: Nurse Practitioner

## 2016-08-07 VITALS — BP 120/72 | HR 76 | Resp 16 | Ht 60.0 in | Wt 140.0 lb

## 2016-08-07 DIAGNOSIS — Z0000 Encounter for general adult medical examination without abnormal findings: Secondary | ICD-10-CM

## 2016-08-07 DIAGNOSIS — Z01419 Encounter for gynecological examination (general) (routine) without abnormal findings: Secondary | ICD-10-CM

## 2016-08-07 NOTE — Patient Instructions (Addendum)

## 2016-08-07 NOTE — Progress Notes (Signed)
Patient ID: Joanna Cohen, female   DOB: Jan 26, 1946, 70 y.o.   MRN: [id number]  70 y.o. G42P2002 Divorced  Caucasian Fe here for annual exam.  The area that vulvar biopsy last year is still tender and irritative at times.  Usually will flare once a month. She does use Kenalog cream prn.  Not dating.  She is very worried about her daughter that is bipolar.  She was erratic yesterday and caused some family commotion about a pair of boots on the Internet that she wanted to order.  Patient's last menstrual period was 11/20/2001 (approximate).          Sexually active: No.  The current method of family planning is abstinence.    Exercising: No.  walking Smoker:  no  Health Maintenance: Pap: 07/23/15, Negative MMG: 10/05/14, 3D, Bi-Rads 2:  Benign findings Colonoscopy:  07/22/10, Normal, repeat in 10 years will get IFOB from PCP in 12/16 BMD:  2016 [place] - osteoporosis per patient TDaP:  01/18/12 Shingles: 2015 Pneumonia: up to date Labs: PCP   reports that she has never smoked. She has never used smokeless tobacco. She reports that she does not drink alcohol or use drugs.  Past Medical History:  Diagnosis Date  . Abnormal Pap smear 1989    Cryo for abnormal pap and normal since  . Anxiety   . AVM (arteriovenous malformation) spine 2000   Right Supra clavicular  . Bursitis    left shoulder (Dr. Cay Schillings)  . Bursitis of left shoulder 03/2013  . Cellulitis   . Depression   . Elevated liver function tests 10/29/15   AST 133, ALT 131  . History of drainage of abscess 03/29/11   Wound care (rt. hand 4th digit )  . Hyperlipidemia   . Hypertension   . Osteoporosis     Past Surgical History:  Procedure Laterality Date  . CERVIX LESION DESTRUCTION  1989   CIN 1  . GYNECOLOGIC CRYOSURGERY  1989  . NASAL SINUS SURGERY  1976   also repair of nasal bone  . TUBAL LIGATION      Current Outpatient Prescriptions  Medication Sig Dispense Refill  . alendronate (FOSAMAX) 70 MG  tablet Take 70 mg by mouth every 7 (seven) days. Take with a full glass of water on an empty stomach.    Marland Kitchen amLODipine (NORVASC) 5 MG tablet Take 5 mg by mouth daily.      . fluticasone (FLONASE) 50 MCG/ACT nasal spray Place 2 sprays into the nose daily.    Marland Kitchen lisinopril-hydrochlorothiazide (PRINZIDE,ZESTORETIC) 20-12.5 MG per tablet Take 1 tablet by mouth daily.    Marland Kitchen omeprazole (PRILOSEC) 20 MG capsule Take 1 capsule by mouth daily.    . rosuvastatin (CRESTOR) 10 MG tablet Take 1 tablet by mouth daily.    Marland Kitchen venlafaxine XR (EFFEXOR-XR) 75 MG 24 hr capsule TAKE 1 CAPSULE DAILY 90 capsule 0   No current facility-administered medications for this visit.     Family History  Problem Relation Age of Onset  . Diabetes Mother   . Heart disease Mother   . Hypertension Mother   . Heart failure Mother   . Liver disease Father   . Diabetes Father   . Diabetes Sister   . Hypertension Sister   . Breast cancer Maternal Aunt     ROS:  Pertinent items are noted in HPI.  Otherwise, a comprehensive ROS was negative.  Exam:   LMP 11/20/2001 (Approximate)    Ht Readings from  Last 3 Encounters:  09/01/15 5' (1.524 m)  07/23/15 5' (1.524 m)  07/20/14 5' (1.524 m)    General appearance: alert, cooperative and appears stated age Head: Normocephalic, without obvious abnormality, atraumatic Neck: no adenopathy, supple, symmetrical, trachea midline and thyroid normal to inspection and palpation Lungs: clear to auscultation bilaterally Breasts: normal appearance, no masses or tenderness Heart: regular rate and rhythm Abdomen: soft, non-tender; no masses,  no organomegaly Extremities: extremities normal, atraumatic, no cyanosis or edema Skin: Skin color, texture, turgor normal. No rashes or lesions Lymph nodes: Cervical, supraclavicular, and axillary nodes normal. No abnormal inguinal nodes palpated Neurologic: Grossly normal   Pelvic: External genitalia:  Lesion on left vulva that is unchanged in  size since last year.  Some surrounding redness.  No other areas of hypopigmented lesions.              Urethra:  normal appearing urethra with no masses, tenderness or lesions              Bartholin's and Skene's: normal                 Vagina: normal appearing vagina with normal color and discharge, no lesions              Cervix: anteverted              Pap taken: No. Bimanual Exam:  Uterus:  normal size, contour, position, consistency, mobility, non-tender              Adnexa: no mass, fullness, tenderness               Rectovaginal: Confirms               Anus:  normal sphincter tone, no lesions  Chaperone present: yes  A:  Well Woman with normal exam  Postmenopausal              Chronic vulvitis left vulva             History of right AV malformation right supraclavicular 2000             History of anxiety and depression             History of HTN, hypercholesterolemia             History of osteoporosis and on Fosamax for 5+ years  She will discuss with PCP about [facility].           P:   Reviewed health and wellness pertinent to exam  Pap smear as above  Mammogram is due 12/17 - will get copy of Mammo from [facility] for last year  Will get copy of BMD from PCP  She will try Coconut oil to this area daily - if not working will try Nystatin cream, she will let us know.  Counseled on breast self exam, mammography screening, osteoporosis, adequate intake of calcium and vitamin D, diet and exercise, Kegel's exercises return annually or prn  An After Visit Summary was printed and given to the patient.

## 2016-08-09 DIAGNOSIS — E782 Mixed hyperlipidemia: Secondary | ICD-10-CM | POA: Diagnosis not present

## 2016-08-09 DIAGNOSIS — R740 Nonspecific elevation of levels of transaminase and lactic acid dehydrogenase [LDH]: Secondary | ICD-10-CM | POA: Diagnosis not present

## 2016-08-09 DIAGNOSIS — R7301 Impaired fasting glucose: Secondary | ICD-10-CM | POA: Diagnosis not present

## 2016-08-09 DIAGNOSIS — E559 Vitamin D deficiency, unspecified: Secondary | ICD-10-CM | POA: Diagnosis not present

## 2016-08-11 NOTE — Progress Notes (Signed)
Encounter reviewed by Dr. Aundria Rud. If left vulvar area is chronically irritative, consider excision.

## 2016-08-29 DIAGNOSIS — Z23 Encounter for immunization: Secondary | ICD-10-CM | POA: Diagnosis not present

## 2016-09-01 DIAGNOSIS — R062 Wheezing: Secondary | ICD-10-CM | POA: Diagnosis not present

## 2016-09-01 DIAGNOSIS — R748 Abnormal levels of other serum enzymes: Secondary | ICD-10-CM | POA: Diagnosis not present

## 2016-09-04 DIAGNOSIS — Z7189 Other specified counseling: Secondary | ICD-10-CM | POA: Diagnosis not present

## 2016-09-05 DIAGNOSIS — J019 Acute sinusitis, unspecified: Secondary | ICD-10-CM | POA: Diagnosis not present

## 2016-09-05 DIAGNOSIS — Z6827 Body mass index (BMI) 27.0-27.9, adult: Secondary | ICD-10-CM | POA: Diagnosis not present

## 2016-09-05 DIAGNOSIS — R740 Nonspecific elevation of levels of transaminase and lactic acid dehydrogenase [LDH]: Secondary | ICD-10-CM | POA: Diagnosis not present

## 2016-09-05 DIAGNOSIS — M810 Age-related osteoporosis without current pathological fracture: Secondary | ICD-10-CM | POA: Diagnosis not present

## 2016-09-15 ENCOUNTER — Telehealth: Payer: Self-pay | Admitting: Nurse Practitioner

## 2016-09-15 NOTE — Telephone Encounter (Signed)
Patient was called about the vulvar irritation at the same area where a biopsy was done last year.  She had some redness around the area at her AEX in September.  Since then she has used Nystatin and will use coconut oil oc and area has cleared and has no complaints.  She is advised that if she gets a flare or recurs to let us know.  She is most appreciative of call.

## 2016-09-15 NOTE — Telephone Encounter (Signed)
Encounter closed

## 2016-09-20 ENCOUNTER — Telehealth: Payer: Self-pay | Admitting: Obstetrics and Gynecology

## 2016-09-20 NOTE — Telephone Encounter (Signed)
Returned patient call, mail box full, unable to leave message

## 2016-09-20 NOTE — Telephone Encounter (Signed)
Patient called and said a nurse called her Friday and told her dr Quincy Simmonds wants her to have a biopsy.  She wants to speak with some one about that.

## 2016-10-04 NOTE — Telephone Encounter (Signed)
Mailbox full, can not accept messages at this time.

## 2016-10-04 NOTE — Telephone Encounter (Signed)
Spoke with patient. Patient states she was in [place] on 09/15/16 when she received called about a biopsy and she would like to follow-up. Reviewed with patient telephone encounter as seen below dated 09/15/16. Patient states area is clear and no complaints at this time. Patient states will call if anything changes. Advised patient Dr. Quincy Simmonds and Kem Boroughs, NP out of office, but will update them once they return and call with any additional recommendations. Patient is aware to call with any changes, verbalizes understanding and is agreeable.    Cc: Dr. Leanne Chang, FNP    09/15/16 5:22 PM  Note    Patient was called about the vulvar irritation at the same area where a biopsy was done last year.  She had some redness around the area at her AEX in September.  Since then she has used Nystatin and will use coconut oil oc and area has cleared and has no complaints.  She is advised that if she gets a flare or recurs to let us know.  She is most appreciative of call.

## 2016-10-09 ENCOUNTER — Other Ambulatory Visit: Payer: Self-pay | Admitting: Gastroenterology

## 2016-10-09 DIAGNOSIS — Z01818 Encounter for other preprocedural examination: Secondary | ICD-10-CM | POA: Diagnosis not present

## 2016-10-09 DIAGNOSIS — Z1211 Encounter for screening for malignant neoplasm of colon: Secondary | ICD-10-CM | POA: Diagnosis not present

## 2016-10-09 DIAGNOSIS — R748 Abnormal levels of other serum enzymes: Secondary | ICD-10-CM | POA: Diagnosis not present

## 2016-10-09 DIAGNOSIS — R945 Abnormal results of liver function studies: Principal | ICD-10-CM

## 2016-10-09 DIAGNOSIS — R7989 Other specified abnormal findings of blood chemistry: Secondary | ICD-10-CM

## 2016-10-16 ENCOUNTER — Ambulatory Visit
Admission: RE | Admit: 2016-10-16 | Discharge: 2016-10-16 | Disposition: A | Discharge status: Home / Self Care | Payer: BLUE CROSS/BLUE SHIELD | Source: Ambulatory Visit | Attending: Gastroenterology | Admitting: Gastroenterology

## 2016-10-16 DIAGNOSIS — R945 Abnormal results of liver function studies: Principal | ICD-10-CM

## 2016-10-16 DIAGNOSIS — K760 Fatty (change of) liver, not elsewhere classified: Secondary | ICD-10-CM | POA: Diagnosis not present

## 2016-10-16 DIAGNOSIS — R7989 Other specified abnormal findings of blood chemistry: Secondary | ICD-10-CM

## 2016-10-18 DIAGNOSIS — Z1231 Encounter for screening mammogram for malignant neoplasm of breast: Secondary | ICD-10-CM | POA: Diagnosis not present

## 2016-10-18 DIAGNOSIS — Z803 Family history of malignant neoplasm of breast: Secondary | ICD-10-CM | POA: Diagnosis not present

## 2016-10-19 DIAGNOSIS — K219 Gastro-esophageal reflux disease without esophagitis: Secondary | ICD-10-CM | POA: Diagnosis not present

## 2016-10-19 DIAGNOSIS — Z6825 Body mass index (BMI) 25.0-25.9, adult: Secondary | ICD-10-CM | POA: Diagnosis not present

## 2016-10-19 DIAGNOSIS — M810 Age-related osteoporosis without current pathological fracture: Secondary | ICD-10-CM | POA: Diagnosis not present

## 2016-10-20 ENCOUNTER — Other Ambulatory Visit: Payer: Self-pay | Admitting: Gastroenterology

## 2016-10-20 DIAGNOSIS — R935 Abnormal findings on diagnostic imaging of other abdominal regions, including retroperitoneum: Secondary | ICD-10-CM

## 2016-10-26 ENCOUNTER — Other Ambulatory Visit: Payer: Medicare Other

## 2016-11-01 DIAGNOSIS — N390 Urinary tract infection, site not specified: Secondary | ICD-10-CM | POA: Diagnosis not present

## 2016-11-01 DIAGNOSIS — E559 Vitamin D deficiency, unspecified: Secondary | ICD-10-CM | POA: Diagnosis not present

## 2016-11-01 DIAGNOSIS — I10 Essential (primary) hypertension: Secondary | ICD-10-CM | POA: Diagnosis not present

## 2016-11-01 DIAGNOSIS — R8299 Other abnormal findings in urine: Secondary | ICD-10-CM | POA: Diagnosis not present

## 2016-11-01 DIAGNOSIS — Z0000 Encounter for general adult medical examination without abnormal findings: Secondary | ICD-10-CM | POA: Diagnosis not present

## 2016-11-02 ENCOUNTER — Other Ambulatory Visit: Payer: Medicare Other

## 2016-11-02 ENCOUNTER — Ambulatory Visit
Admission: RE | Admit: 2016-11-02 | Discharge: 2016-11-02 | Disposition: A | Discharge status: Home / Self Care | Payer: Medicare Other | Source: Ambulatory Visit | Attending: Gastroenterology | Admitting: Gastroenterology

## 2016-11-02 DIAGNOSIS — N281 Cyst of kidney, acquired: Secondary | ICD-10-CM | POA: Diagnosis not present

## 2016-11-02 DIAGNOSIS — R935 Abnormal findings on diagnostic imaging of other abdominal regions, including retroperitoneum: Secondary | ICD-10-CM

## 2016-11-08 DIAGNOSIS — Z23 Encounter for immunization: Secondary | ICD-10-CM | POA: Diagnosis not present

## 2016-11-08 DIAGNOSIS — M5489 Other dorsalgia: Secondary | ICD-10-CM | POA: Diagnosis not present

## 2016-11-08 DIAGNOSIS — Z1389 Encounter for screening for other disorder: Secondary | ICD-10-CM | POA: Diagnosis not present

## 2016-11-08 DIAGNOSIS — N281 Cyst of kidney, acquired: Secondary | ICD-10-CM | POA: Diagnosis not present

## 2016-11-08 DIAGNOSIS — K219 Gastro-esophageal reflux disease without esophagitis: Secondary | ICD-10-CM | POA: Diagnosis not present

## 2016-11-08 DIAGNOSIS — E559 Vitamin D deficiency, unspecified: Secondary | ICD-10-CM | POA: Diagnosis not present

## 2016-11-08 DIAGNOSIS — Z0000 Encounter for general adult medical examination without abnormal findings: Secondary | ICD-10-CM | POA: Diagnosis not present

## 2016-11-30 DIAGNOSIS — Z1212 Encounter for screening for malignant neoplasm of rectum: Secondary | ICD-10-CM | POA: Diagnosis not present

## 2016-12-06 DIAGNOSIS — E559 Vitamin D deficiency, unspecified: Secondary | ICD-10-CM | POA: Diagnosis not present

## 2016-12-06 DIAGNOSIS — R740 Nonspecific elevation of levels of transaminase and lactic acid dehydrogenase [LDH]: Secondary | ICD-10-CM | POA: Diagnosis not present

## 2016-12-06 DIAGNOSIS — J309 Allergic rhinitis, unspecified: Secondary | ICD-10-CM | POA: Diagnosis not present

## 2016-12-06 DIAGNOSIS — E782 Mixed hyperlipidemia: Secondary | ICD-10-CM | POA: Diagnosis not present

## 2016-12-11 DIAGNOSIS — E559 Vitamin D deficiency, unspecified: Secondary | ICD-10-CM | POA: Diagnosis not present

## 2016-12-11 DIAGNOSIS — E782 Mixed hyperlipidemia: Secondary | ICD-10-CM | POA: Diagnosis not present

## 2016-12-11 DIAGNOSIS — R740 Nonspecific elevation of levels of transaminase and lactic acid dehydrogenase [LDH]: Secondary | ICD-10-CM | POA: Diagnosis not present

## 2016-12-20 DIAGNOSIS — R740 Nonspecific elevation of levels of transaminase and lactic acid dehydrogenase [LDH]: Secondary | ICD-10-CM | POA: Diagnosis not present

## 2016-12-20 DIAGNOSIS — E782 Mixed hyperlipidemia: Secondary | ICD-10-CM | POA: Diagnosis not present

## 2016-12-20 DIAGNOSIS — E559 Vitamin D deficiency, unspecified: Secondary | ICD-10-CM | POA: Diagnosis not present

## 2017-03-27 DIAGNOSIS — H35033 Hypertensive retinopathy, bilateral: Secondary | ICD-10-CM | POA: Diagnosis not present

## 2017-04-03 ENCOUNTER — Other Ambulatory Visit: Payer: Self-pay | Admitting: Physician Assistant

## 2017-04-03 DIAGNOSIS — L570 Actinic keratosis: Secondary | ICD-10-CM | POA: Diagnosis not present

## 2017-04-03 DIAGNOSIS — D492 Neoplasm of unspecified behavior of bone, soft tissue, and skin: Secondary | ICD-10-CM | POA: Diagnosis not present

## 2017-04-09 DIAGNOSIS — E559 Vitamin D deficiency, unspecified: Secondary | ICD-10-CM | POA: Diagnosis not present

## 2017-04-09 DIAGNOSIS — R740 Nonspecific elevation of levels of transaminase and lactic acid dehydrogenase [LDH]: Secondary | ICD-10-CM | POA: Diagnosis not present

## 2017-04-09 DIAGNOSIS — Z79899 Other long term (current) drug therapy: Secondary | ICD-10-CM | POA: Diagnosis not present

## 2017-04-09 DIAGNOSIS — E782 Mixed hyperlipidemia: Secondary | ICD-10-CM | POA: Diagnosis not present

## 2017-04-18 DIAGNOSIS — R740 Nonspecific elevation of levels of transaminase and lactic acid dehydrogenase [LDH]: Secondary | ICD-10-CM | POA: Diagnosis not present

## 2017-04-18 DIAGNOSIS — E559 Vitamin D deficiency, unspecified: Secondary | ICD-10-CM | POA: Diagnosis not present

## 2017-04-18 DIAGNOSIS — E782 Mixed hyperlipidemia: Secondary | ICD-10-CM | POA: Diagnosis not present

## 2017-06-05 ENCOUNTER — Telehealth: Payer: Self-pay | Admitting: Certified Nurse Midwife

## 2017-06-05 NOTE — Telephone Encounter (Signed)
LMTCB/:NP/ .CX/LETTER SENT/RD

## 2017-06-22 IMAGING — US US ABDOMEN LIMITED
1 series · 14 of 25 positions shown · non-contrast
Comparison: None.

CLINICAL DATA: Abnormal liver function tests

EXAM:
US ABDOMEN LIMITED - RIGHT UPPER QUADRANT

[Series 1: us abdomen limited · 0.28mm/px · 45 acquisitions, 14 frames shown]
[im 1/45]
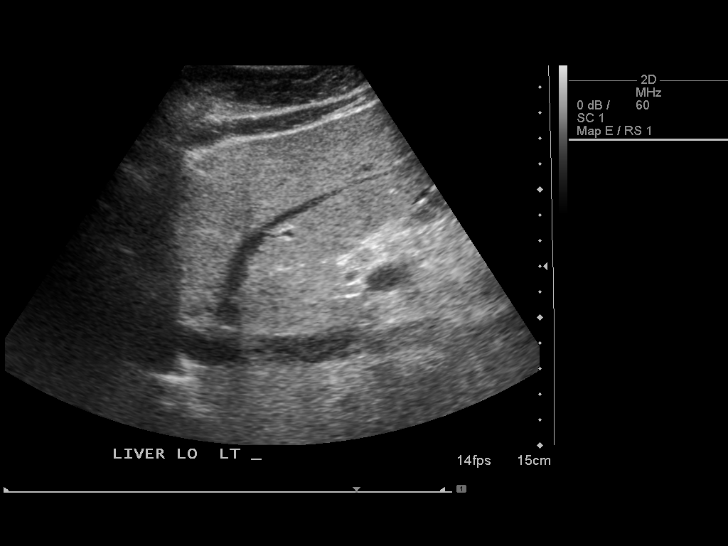
[im 4/45]
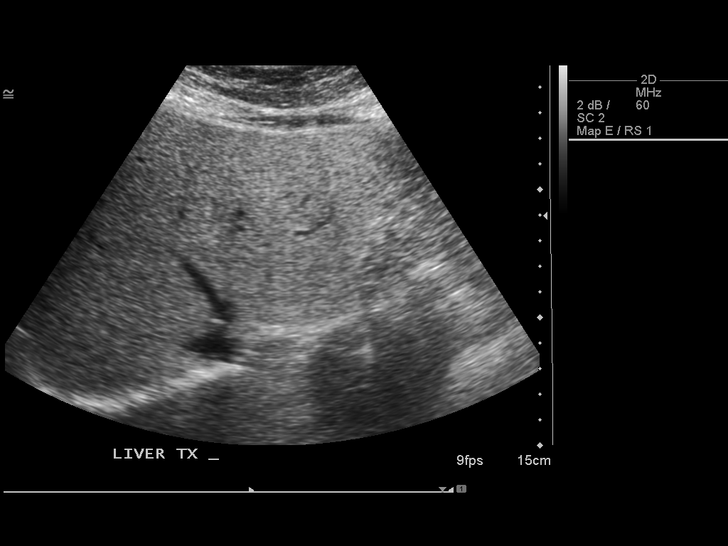
[im 8/45]
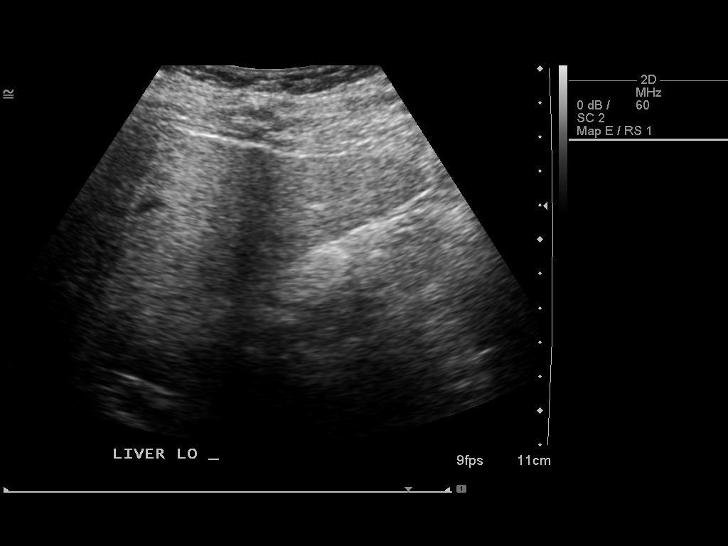
[im 12/45]
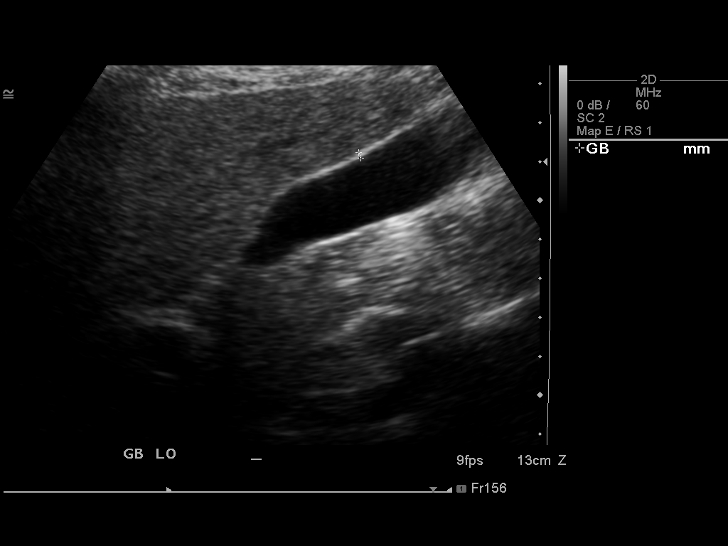
[im 15/45]
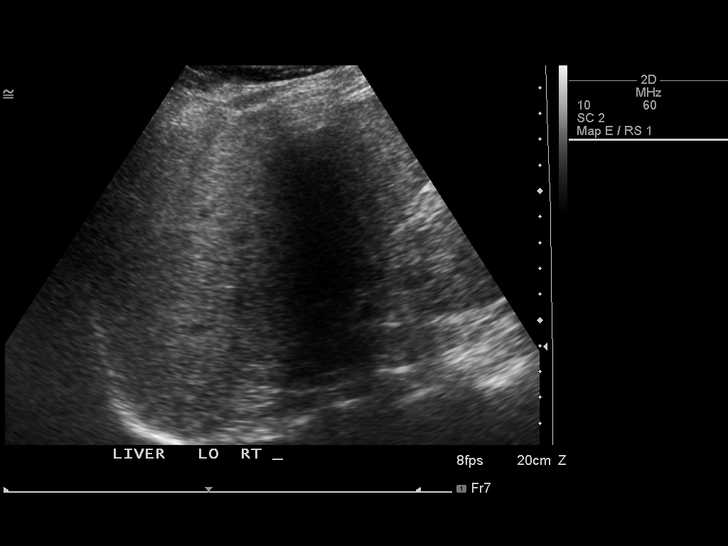
[im 17/45]
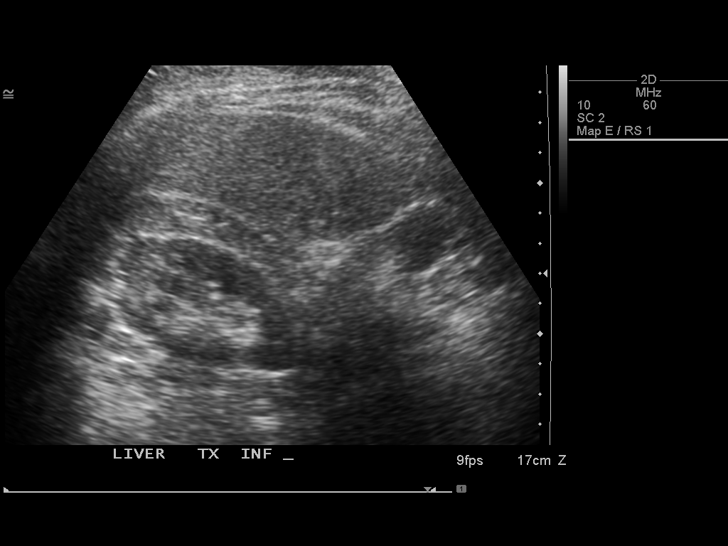
[im 21/45]
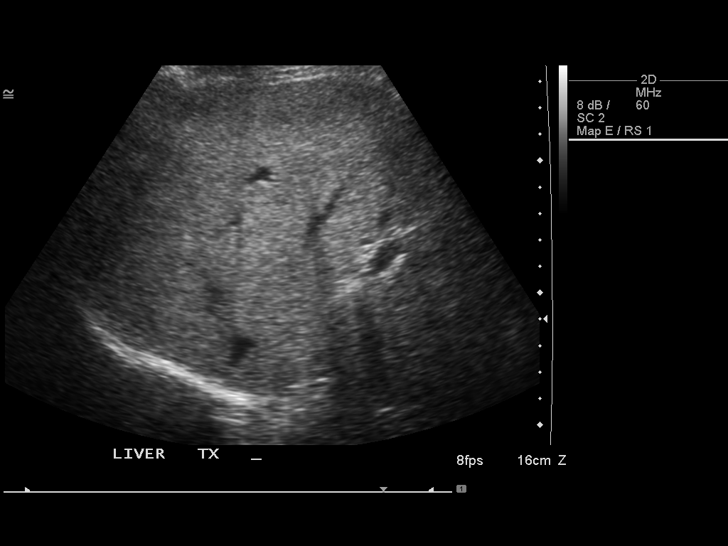
[im 24/45]
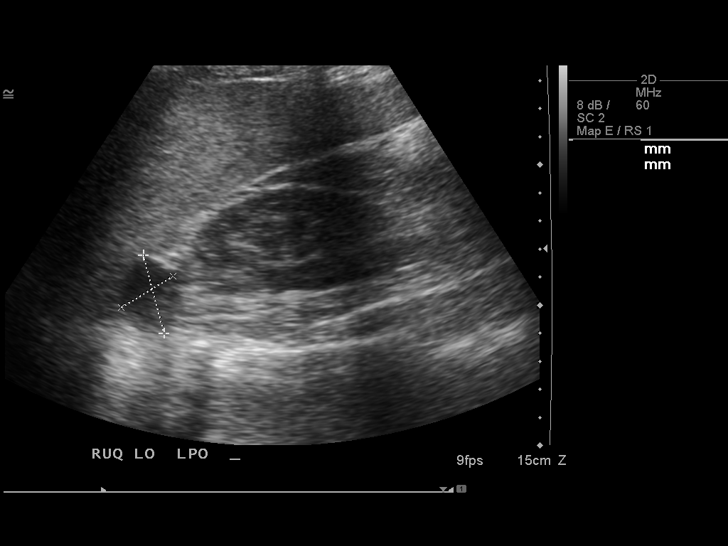
[im 28/45]
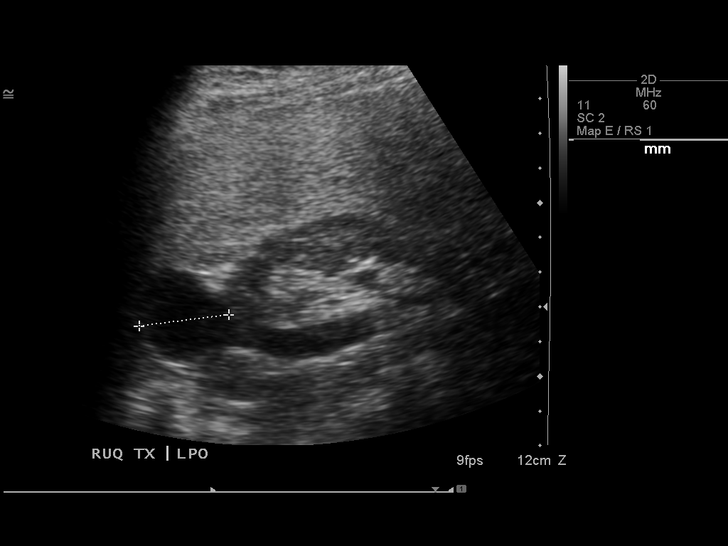
[im 30/45]
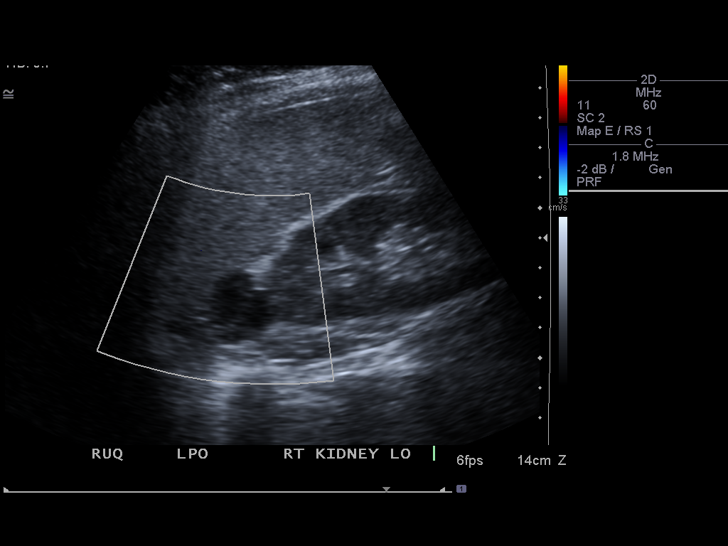
[im 34/45]
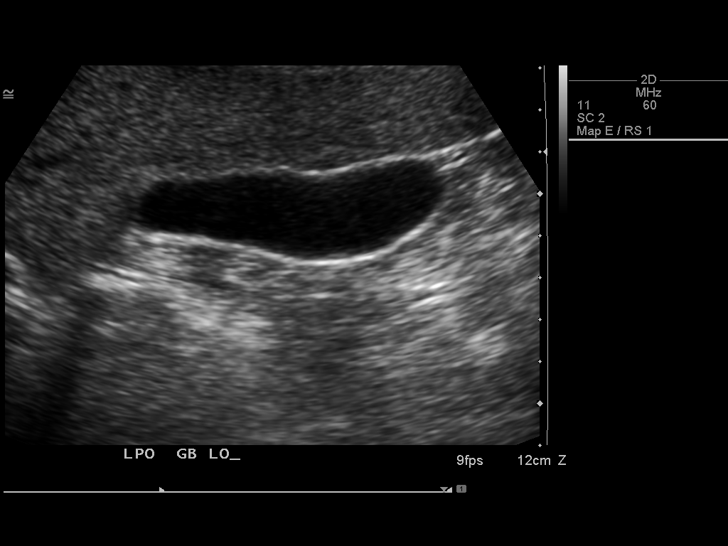
[im 37/45]
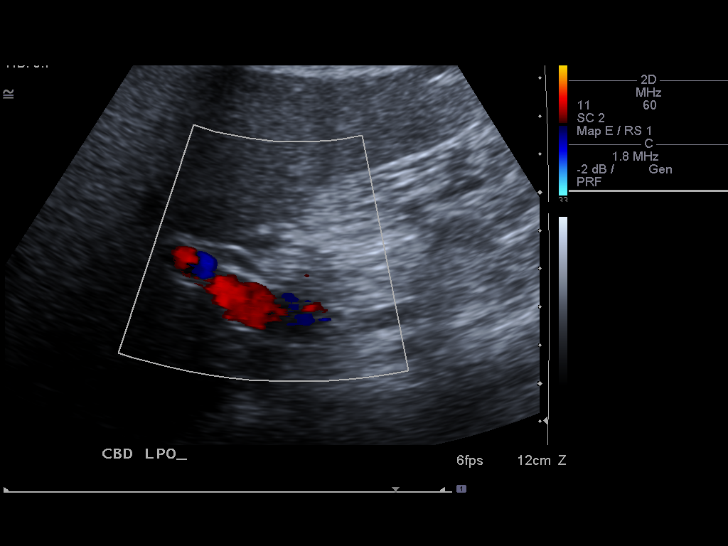
[im 41/45]
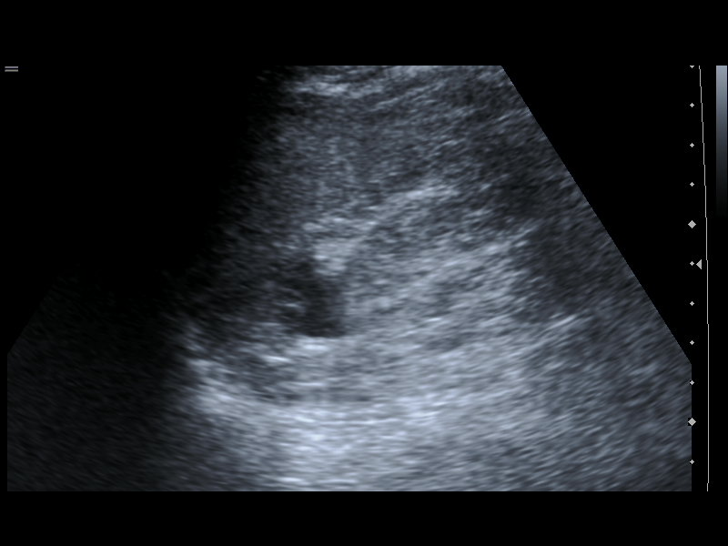
[im 45/45]
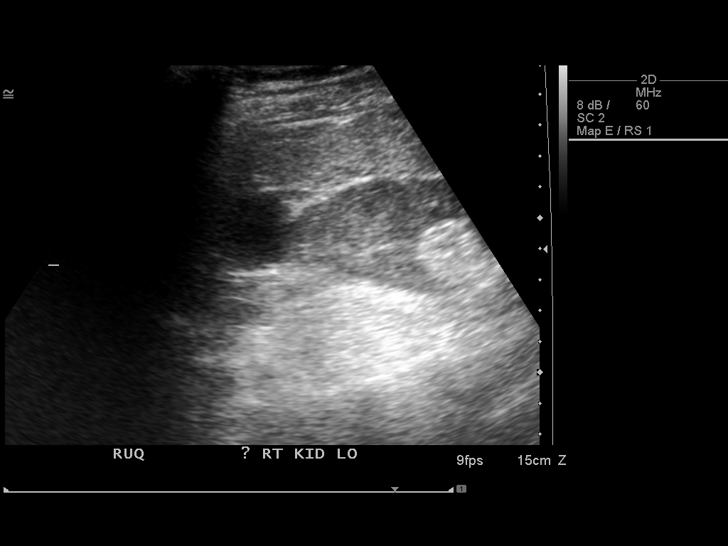

[14 of 25 positions shown; findings below may reference images not displayed]

FINDINGS: Gallbladder:

No gallstones or wall thickening visualized. No sonographic Murphy
sign noted by sonographer.

Common bile duct:

Diameter: 4 mm

Liver:

The liver is subjectively enlarged. It is increased in echogenicity
without focal mass.

Additional findings: There is a hypoechoic mass measuring 2.9 cm at
the upper pole of the right kidney.
IMPRESSION: Possible mass in the upper pole of the right kidney versus right
adrenal gland. CT is recommended to further characterize

Diffuse hepatic steatosis with hepatomegaly.  No focal liver mass.

## 2017-07-25 DIAGNOSIS — I10 Essential (primary) hypertension: Secondary | ICD-10-CM | POA: Diagnosis not present

## 2017-08-08 DIAGNOSIS — Z79899 Other long term (current) drug therapy: Secondary | ICD-10-CM | POA: Diagnosis not present

## 2017-08-08 DIAGNOSIS — E782 Mixed hyperlipidemia: Secondary | ICD-10-CM | POA: Diagnosis not present

## 2017-08-08 DIAGNOSIS — E559 Vitamin D deficiency, unspecified: Secondary | ICD-10-CM | POA: Diagnosis not present

## 2017-08-08 DIAGNOSIS — Z139 Encounter for screening, unspecified: Secondary | ICD-10-CM | POA: Diagnosis not present

## 2017-08-08 DIAGNOSIS — R740 Nonspecific elevation of levels of transaminase and lactic acid dehydrogenase [LDH]: Secondary | ICD-10-CM | POA: Diagnosis not present

## 2017-08-13 DIAGNOSIS — R740 Nonspecific elevation of levels of transaminase and lactic acid dehydrogenase [LDH]: Secondary | ICD-10-CM | POA: Diagnosis not present

## 2017-08-13 DIAGNOSIS — E782 Mixed hyperlipidemia: Secondary | ICD-10-CM | POA: Diagnosis not present

## 2017-08-13 DIAGNOSIS — Z719 Counseling, unspecified: Secondary | ICD-10-CM | POA: Diagnosis not present

## 2017-08-13 DIAGNOSIS — Z008 Encounter for other general examination: Secondary | ICD-10-CM | POA: Diagnosis not present

## 2017-08-13 DIAGNOSIS — E559 Vitamin D deficiency, unspecified: Secondary | ICD-10-CM | POA: Diagnosis not present

## 2017-08-14 ENCOUNTER — Ambulatory Visit: Payer: BLUE CROSS/BLUE SHIELD | Admitting: Nurse Practitioner

## 2017-08-30 ENCOUNTER — Ambulatory Visit (INDEPENDENT_AMBULATORY_CARE_PROVIDER_SITE_OTHER): Payer: BLUE CROSS/BLUE SHIELD | Admitting: Obstetrics and Gynecology

## 2017-08-30 ENCOUNTER — Ambulatory Visit: Payer: BLUE CROSS/BLUE SHIELD | Admitting: Obstetrics and Gynecology

## 2017-08-30 ENCOUNTER — Other Ambulatory Visit (HOSPITAL_COMMUNITY)
Admission: RE | Admit: 2017-08-30 | Discharge: 2017-08-30 | Disposition: A | Discharge status: Home / Self Care | Payer: BLUE CROSS/BLUE SHIELD | Source: Ambulatory Visit | Attending: Obstetrics and Gynecology | Admitting: Obstetrics and Gynecology

## 2017-08-30 ENCOUNTER — Encounter: Payer: Self-pay | Admitting: Obstetrics and Gynecology

## 2017-08-30 VITALS — BP 133/70 | HR 76 | Resp 16 | Ht 60.0 in | Wt 142.6 lb

## 2017-08-30 DIAGNOSIS — Z01419 Encounter for gynecological examination (general) (routine) without abnormal findings: Secondary | ICD-10-CM | POA: Insufficient documentation

## 2017-08-30 DIAGNOSIS — Z23 Encounter for immunization: Secondary | ICD-10-CM | POA: Diagnosis not present

## 2017-08-30 NOTE — Progress Notes (Deleted)
71 y.o. G25P2002 Divorced Caucasian female here for annual exam.    PCP:     Patient's last menstrual period was 11/20/2001 (approximate).           Sexually active: {yes no:314532}  The current method of family planning is post menopausal status.    Exercising: {yes no:314532}  {types:19826} Smoker:  no  Health Maintenance: Pap: 07-23-15 Neg History of abnormal Pap:  Yes, Hx cryotherapy 1989 MMG: 10-18-16 3D Density B/Neg/BiRads2:[facility] Colonoscopy: 07/22/10, Normal, repeat in 10 years will get IFOB from PCP in 12/16 BMD:   ***2016  Result  ***Osteoporosis per patient with [facility] TDaP:  12-29-11 Gardasil:   no HIV:*** Hep C:*** Screening Labs:  Hb today: ***, Urine today: ***   reports that she has never smoked. She has never used smokeless tobacco. She reports that she does not drink alcohol or use drugs.  Past Medical History:  Diagnosis Date  . Abnormal Pap smear 1989    Cryo for abnormal pap and normal since  . Anxiety   . AVM (arteriovenous malformation) spine 2000   Right Supra clavicular  . Bursitis    left shoulder (Dr. Cay Schillings)  . Bursitis of left shoulder 03/2013  . Cellulitis   . Depression   . Elevated liver function tests 10/29/15   AST 133, ALT 131  . History of drainage of abscess 03/29/11   Wound care (rt. hand 4th digit )  . Hyperlipidemia   . Hypertension   . Osteoporosis     Past Surgical History:  Procedure Laterality Date  . CERVIX LESION DESTRUCTION  1989   CIN 1  . GYNECOLOGIC CRYOSURGERY  1989  . NASAL SINUS SURGERY  1976   also repair of nasal bone  . TUBAL LIGATION      Current Outpatient Prescriptions  Medication Sig Dispense Refill  . alendronate (FOSAMAX) 70 MG tablet Take 70 mg by mouth every 7 (seven) days. Take with a full glass of water on an empty stomach.    Marland Kitchen amLODipine (NORVASC) 5 MG tablet Take 5 mg by mouth daily.      . fluticasone (FLONASE) 50 MCG/ACT nasal spray Place 2 sprays into the nose daily.    Marland Kitchen  lisinopril-hydrochlorothiazide (PRINZIDE,ZESTORETIC) 20-12.5 MG per tablet Take 1 tablet by mouth daily.    Marland Kitchen omeprazole (PRILOSEC) 20 MG capsule Take 1 capsule by mouth daily.    . rosuvastatin (CRESTOR) 10 MG tablet Take 1 tablet by mouth daily.    Marland Kitchen venlafaxine XR (EFFEXOR-XR) 75 MG 24 hr capsule TAKE 1 CAPSULE DAILY 90 capsule 0   No current facility-administered medications for this visit.     Family History  Problem Relation Age of Onset  . Diabetes Mother   . Heart disease Mother   . Hypertension Mother   . Heart failure Mother   . Liver disease Father   . Diabetes Father   . Diabetes Sister   . Hypertension Sister   . Breast cancer Maternal Aunt     ROS:  Pertinent items are noted in HPI.  Otherwise, a comprehensive ROS was negative.  Exam:   LMP 11/20/2001 (Approximate)     General appearance: alert, cooperative and appears stated age Head: Normocephalic, without obvious abnormality, atraumatic Neck: no adenopathy, supple, symmetrical, trachea midline and thyroid normal to inspection and palpation Lungs: clear to auscultation bilaterally Breasts: normal appearance, no masses or tenderness, No nipple retraction or dimpling, No nipple discharge or bleeding, No axillary or supraclavicular adenopathy Heart: regular rate  and rhythm Abdomen: soft, non-tender; no masses, no organomegaly Extremities: extremities normal, atraumatic, no cyanosis or edema Skin: Skin color, texture, turgor normal. No rashes or lesions Lymph nodes: Cervical, supraclavicular, and axillary nodes normal. No abnormal inguinal nodes palpated Neurologic: Grossly normal  Pelvic: External genitalia:  no lesions              Urethra:  normal appearing urethra with no masses, tenderness or lesions              Bartholins and Skenes: normal                 Vagina: normal appearing vagina with normal color and discharge, no lesions              Cervix: no lesions              Pap taken: {yes  no:314532} Bimanual Exam:  Uterus:  normal size, contour, position, consistency, mobility, non-tender              Adnexa: no mass, fullness, tenderness              Rectal exam: {yes no:314532}.  Confirms.              Anus:  normal sphincter tone, no lesions  Chaperone was present for exam.  Assessment:   Well woman visit with normal exam.   Plan: Mammogram screening discussed. Recommended self breast awareness. Pap and HR HPV as above. Guidelines for Calcium, Vitamin D, regular exercise program including cardiovascular and weight bearing exercise.   Follow up annually and prn.   Additional counseling given.  {yes [id number]. _______ minutes face to face time of which over 50% was spent in counseling.    After visit summary provided.

## 2017-08-30 NOTE — Progress Notes (Signed)
71 y.o. G1P2002 Divorced Caucasian female here for annual exam.    Still has vulvar itching.  Biopsy  On 10/12.16  - chronic inflammation.  Not using any medication to treat it.  Used Kenalog cream in the past, which has worked well.   Taking Actonel for osteoporosis through PCP. Labs through PCP. Will do flu vaccine at PCP office this year.   ROS - does have some vulvar itching.  No vaginal bleeding, no abnormal discharge, no bladder or bowel problems.    PCP:  [name], MD  Patient's last menstrual period was 11/20/2001 (approximate).           Sexually active: No. female The current method of family planning is post menopausal status.    Exercising: No.   Smoker:  no  Health Maintenance: Pap: 07-23-15 Neg History of abnormal Pap:  Yes, Hx cryotherapy 1989. MMG: 10-18-16 3D Density B/Neg/BiRads2:[facility] Colonoscopy:  07/22/10, Normal, repeat in 10 years will get IFOB from PCP in 12/16 BMD:   2016 Result: Osteoporosis per patient TDaP: 01-18-12 Gardasil:   no HIV:no Hep C:within last 5 years--Neg Screening Labs:  Hb today: PCP, Urine today: not done   reports that she has never smoked. She has never used smokeless tobacco. She reports that she does not drink alcohol or use drugs.  Past Medical History:  Diagnosis Date  . Abnormal Pap smear 1989    Cryo for abnormal pap and normal since  . Anxiety   . AVM (arteriovenous malformation) spine 2000   Right Supra clavicular  . Bursitis    left shoulder (Dr. Cay Schillings)  . Bursitis of left shoulder 03/2013  . Cellulitis   . Depression   . Elevated liver function tests 10/29/15   AST 133, ALT 131  . History of drainage of abscess 03/29/11   Wound care (rt. hand 4th digit )  . Hyperlipidemia   . Hypertension   . Osteoporosis     Past Surgical History:  Procedure Laterality Date  . CERVIX LESION DESTRUCTION  1989   CIN 1  . GYNECOLOGIC CRYOSURGERY  1989  . NASAL SINUS SURGERY  1976   also repair of nasal bone  . TUBAL  LIGATION      Current Outpatient Prescriptions  Medication Sig Dispense Refill  . amLODipine (NORVASC) 5 MG tablet Take 5 mg by mouth daily.      Marland Kitchen CETIRIZINE HCL PO Take 1 tablet by mouth daily.    . fluticasone (FLONASE) 50 MCG/ACT nasal spray Place 2 sprays into the nose daily.    Marland Kitchen lisinopril-hydrochlorothiazide (PRINZIDE,ZESTORETIC) 20-12.5 MG per tablet Take 1 tablet by mouth daily.    Marland Kitchen omeprazole (PRILOSEC) 40 MG capsule Take 40 mg by mouth daily.  0  . risedronate (ACTONEL) 150 MG tablet Take 1 tablet by mouth every 30 (thirty) days.    . rosuvastatin (CRESTOR) 10 MG tablet Take 10 mg by mouth daily.    Marland Kitchen venlafaxine XR (EFFEXOR-XR) 75 MG 24 hr capsule TAKE 1 CAPSULE DAILY 90 capsule 0   No current facility-administered medications for this visit.     Family History  Problem Relation Age of Onset  . Diabetes Mother   . Heart disease Mother   . Hypertension Mother   . Heart failure Mother   . Liver disease Father   . Diabetes Father   . Diabetes Sister   . Hypertension Sister   . Breast cancer Maternal Aunt     ROS:  Pertinent items are noted in  HPI.  Otherwise, a comprehensive ROS was negative.  Exam:   BP 133/70 (BP Location: Right Arm, Patient Position: Sitting, Cuff Size: Normal)   Pulse 76   Resp 16   Ht 5' (1.524 m)   Wt 142 lb 9.6 oz (64.7 kg)   LMP 11/20/2001 (Approximate)   BMI 27.85 kg/m     General appearance: alert, cooperative and appears stated age Head: Normocephalic, without obvious abnormality, atraumatic Neck: no adenopathy, supple, symmetrical, trachea midline and thyroid normal to inspection and palpation Lungs: clear to auscultation bilaterally Breasts: normal appearance, no masses or tenderness, No nipple retraction or dimpling, No nipple discharge or bleeding, No axillary or supraclavicular adenopathy Heart: regular rate and rhythm Abdomen: soft, non-tender; no masses, no organomegaly Extremities: extremities normal, atraumatic, no  cyanosis or edema Skin: Skin color, texture, turgor normal.  Red raised rash of the right anterior tibial surface.  Lymph nodes: Cervical, supraclavicular, and axillary nodes normal. No abnormal inguinal nodes palpated Neurologic: Grossly normal  Pelvic: External genitalia:  no lesions.  Very subtle erythema of left inferior medial labia majora.               Urethra:  normal appearing urethra with no masses, tenderness or lesions              Bartholins and Skenes: normal                 Vagina: normal appearing vagina with normal color and discharge, no lesions              Cervix: no lesions              Pap taken: Yes.   Bimanual Exam:  Uterus:  normal size, contour, position, consistency, mobility, non-tender              Adnexa: no mass, fullness, tenderness              Rectal exam: Yes.  .  Confirms.              Anus:  normal sphincter tone, no lesions  Chaperone was present for exam.  Assessment:   Well woman visit with normal exam. Hx chronic vulvitis.  Dermatitis of leg. Seeing dermatology.  Osteoporosis.  On Actonel. PCP managing.  Remote hx of abnormal pap.  Plan: Mammogram screening discussed. Recommended self breast awareness. Pap and HR HPV as above. Guidelines for Calcium, Vitamin D, regular exercise program including cardiovascular and weight bearing exercise. Try Vaseline for moisturizing of the skin.   No steroid given.  Follow up annually and prn.   After visit summary provided.

## 2017-08-30 NOTE — Patient Instructions (Signed)

## 2017-09-03 LAB — CYTOLOGY - PAP: DIAGNOSIS: NEGATIVE

## 2017-10-01 DIAGNOSIS — R748 Abnormal levels of other serum enzymes: Secondary | ICD-10-CM | POA: Diagnosis not present

## 2017-10-04 DIAGNOSIS — R748 Abnormal levels of other serum enzymes: Secondary | ICD-10-CM | POA: Diagnosis not present

## 2017-10-04 DIAGNOSIS — Z1211 Encounter for screening for malignant neoplasm of colon: Secondary | ICD-10-CM | POA: Diagnosis not present

## 2017-10-08 DIAGNOSIS — J069 Acute upper respiratory infection, unspecified: Secondary | ICD-10-CM | POA: Diagnosis not present

## 2017-10-22 DIAGNOSIS — Z1231 Encounter for screening mammogram for malignant neoplasm of breast: Secondary | ICD-10-CM | POA: Diagnosis not present

## 2017-11-20 HISTORY — PX: OTHER SURGICAL HISTORY: SHX169

## 2017-11-22 DIAGNOSIS — I10 Essential (primary) hypertension: Secondary | ICD-10-CM | POA: Diagnosis not present

## 2017-11-22 DIAGNOSIS — M810 Age-related osteoporosis without current pathological fracture: Secondary | ICD-10-CM | POA: Diagnosis not present

## 2017-11-22 DIAGNOSIS — E7849 Other hyperlipidemia: Secondary | ICD-10-CM | POA: Diagnosis not present

## 2017-11-22 DIAGNOSIS — R82998 Other abnormal findings in urine: Secondary | ICD-10-CM | POA: Diagnosis not present

## 2017-11-22 DIAGNOSIS — E559 Vitamin D deficiency, unspecified: Secondary | ICD-10-CM | POA: Diagnosis not present

## 2017-11-27 DIAGNOSIS — K219 Gastro-esophageal reflux disease without esophagitis: Secondary | ICD-10-CM | POA: Diagnosis not present

## 2017-11-27 DIAGNOSIS — F330 Major depressive disorder, recurrent, mild: Secondary | ICD-10-CM | POA: Diagnosis not present

## 2017-11-27 DIAGNOSIS — Z1389 Encounter for screening for other disorder: Secondary | ICD-10-CM | POA: Diagnosis not present

## 2017-11-27 DIAGNOSIS — I10 Essential (primary) hypertension: Secondary | ICD-10-CM | POA: Diagnosis not present

## 2017-11-27 DIAGNOSIS — Z0000 Encounter for general adult medical examination without abnormal findings: Secondary | ICD-10-CM | POA: Diagnosis not present

## 2017-11-27 DIAGNOSIS — E559 Vitamin D deficiency, unspecified: Secondary | ICD-10-CM | POA: Diagnosis not present

## 2017-11-28 DIAGNOSIS — G5602 Carpal tunnel syndrome, left upper limb: Secondary | ICD-10-CM | POA: Insufficient documentation

## 2017-11-28 DIAGNOSIS — M1812 Unilateral primary osteoarthritis of first carpometacarpal joint, left hand: Secondary | ICD-10-CM | POA: Diagnosis not present

## 2017-12-12 DIAGNOSIS — E559 Vitamin D deficiency, unspecified: Secondary | ICD-10-CM | POA: Diagnosis not present

## 2017-12-12 DIAGNOSIS — E782 Mixed hyperlipidemia: Secondary | ICD-10-CM | POA: Diagnosis not present

## 2017-12-12 DIAGNOSIS — R740 Nonspecific elevation of levels of transaminase and lactic acid dehydrogenase [LDH]: Secondary | ICD-10-CM | POA: Diagnosis not present

## 2017-12-12 DIAGNOSIS — Z719 Counseling, unspecified: Secondary | ICD-10-CM | POA: Diagnosis not present

## 2017-12-12 DIAGNOSIS — Z008 Encounter for other general examination: Secondary | ICD-10-CM | POA: Diagnosis not present

## 2018-04-02 DIAGNOSIS — R748 Abnormal levels of other serum enzymes: Secondary | ICD-10-CM | POA: Diagnosis not present

## 2018-04-03 DIAGNOSIS — Z7689 Persons encountering health services in other specified circumstances: Secondary | ICD-10-CM | POA: Diagnosis not present

## 2018-04-03 DIAGNOSIS — E782 Mixed hyperlipidemia: Secondary | ICD-10-CM | POA: Diagnosis not present

## 2018-04-03 DIAGNOSIS — Z719 Counseling, unspecified: Secondary | ICD-10-CM | POA: Diagnosis not present

## 2018-04-03 DIAGNOSIS — R740 Nonspecific elevation of levels of transaminase and lactic acid dehydrogenase [LDH]: Secondary | ICD-10-CM | POA: Diagnosis not present

## 2018-04-05 DIAGNOSIS — R748 Abnormal levels of other serum enzymes: Secondary | ICD-10-CM | POA: Diagnosis not present

## 2018-05-08 DIAGNOSIS — Z79899 Other long term (current) drug therapy: Secondary | ICD-10-CM | POA: Diagnosis not present

## 2018-05-08 DIAGNOSIS — E782 Mixed hyperlipidemia: Secondary | ICD-10-CM | POA: Diagnosis not present

## 2018-05-14 ENCOUNTER — Encounter: Payer: Self-pay | Admitting: Podiatry

## 2018-05-14 ENCOUNTER — Ambulatory Visit: Payer: Medicare Other | Admitting: Podiatry

## 2018-05-14 ENCOUNTER — Ambulatory Visit (INDEPENDENT_AMBULATORY_CARE_PROVIDER_SITE_OTHER): Payer: BLUE CROSS/BLUE SHIELD

## 2018-05-14 VITALS — BP 163/80 | HR 67 | Resp 16

## 2018-05-14 DIAGNOSIS — M779 Enthesopathy, unspecified: Secondary | ICD-10-CM | POA: Diagnosis not present

## 2018-05-14 DIAGNOSIS — M778 Other enthesopathies, not elsewhere classified: Secondary | ICD-10-CM

## 2018-05-14 DIAGNOSIS — M1990 Unspecified osteoarthritis, unspecified site: Secondary | ICD-10-CM | POA: Diagnosis not present

## 2018-05-15 NOTE — Progress Notes (Signed)
Subjective:  Patient ID: Joanna Cohen, female    DOB: [birth year],  MRN: [id number] HPI Chief Complaint  Patient presents with  . Foot Pain    Dorsal midfoot right - aching occasionally and swelling x 1 month, no injury, not painful all the time though, no treatment  . New Patient (Initial Visit)    72 y.o. female presents with the above complaint.   ROS: Denies fever chills nausea vomiting muscle aches pains calf pain back pain chest pain shortness of breath.  Past Medical History:  Diagnosis Date  . Abnormal Pap smear 1989    Cryo for abnormal pap and normal since  . Anxiety   . AVM (arteriovenous malformation) spine 2000   Right Supra clavicular  . Bursitis    left shoulder (Dr. Cay Schillings)  . Bursitis of left shoulder 03/2013  . Cellulitis   . Depression   . Elevated liver function tests 10/29/15   AST 133, ALT 131  . History of drainage of abscess 03/29/11   Wound care (rt. hand 4th digit )  . Hyperlipidemia   . Hypertension   . Osteoporosis    Past Surgical History:  Procedure Laterality Date  . CERVIX LESION DESTRUCTION  1989   CIN 1  . GYNECOLOGIC CRYOSURGERY  1989  . NASAL SINUS SURGERY  1976   also repair of nasal bone  . TUBAL LIGATION      Current Outpatient Medications:  .  amLODipine (NORVASC) 5 MG tablet, Take 5 mg by mouth daily.  , Disp: , Rfl:  .  CETIRIZINE HCL PO, Take 1 tablet by mouth daily., Disp: , Rfl:  .  fluticasone (FLONASE) 50 MCG/ACT nasal spray, Place 2 sprays into the nose daily., Disp: , Rfl:  .  lisinopril-hydrochlorothiazide (PRINZIDE,ZESTORETIC) 20-12.5 MG per tablet, Take 1 tablet by mouth daily., Disp: , Rfl:  .  omeprazole (PRILOSEC) 40 MG capsule, Take 40 mg by mouth daily., Disp: , Rfl: 0 .  risedronate (ACTONEL) 150 MG tablet, Take 1 tablet by mouth every 30 (thirty) days., Disp: , Rfl:  .  rosuvastatin (CRESTOR) 10 MG tablet, Take 10 mg by mouth daily., Disp: , Rfl:  .  venlafaxine XR (EFFEXOR-XR) 75 MG 24 hr capsule, TAKE 1  CAPSULE DAILY, Disp: 90 capsule, Rfl: 0 .  Vitamin D, Ergocalciferol, (DRISDOL) 50000 units CAPS capsule, , Disp: , Rfl:   Allergies  Allergen Reactions  . Bacitracin-Polymyxin B     REACTION: red around area of ointment   Review of Systems Objective:   Vitals:   05/14/18 0956  BP: (!) 163/80  Pulse: 67  Resp: 16    General: Well developed, nourished, in no acute distress, alert and oriented x3   Dermatological: Skin is warm, dry and supple bilateral. Nails x 10 are well maintained; remaining integument appears unremarkable at this time. There are no open sores, no preulcerative lesions, no rash or signs of infection present.  Vascular: Dorsalis Pedis artery and Posterior Tibial artery pedal pulses are 2/4 bilateral with immedate capillary fill time. Pedal hair growth present. No varicosities and no lower extremity edema present bilateral.   Neruologic: Grossly intact via light touch bilateral. Vibratory intact via tuning fork bilateral. Protective threshold with Semmes Wienstein monofilament intact to all pedal sites bilateral. Patellar and Achilles deep tendon reflexes 2+ bilateral. No Babinski or clonus noted bilateral.   Musculoskeletal: No gross boney pedal deformities bilateral. No pain, crepitus, or limitation noted with foot and ankle range of motion bilateral. Muscular strength  5/5 in all groups tested bilateral.  She has mild tenderness on range of motion of the forefoot.  Otherwise she has tenderness on direct palpation dorsal aspect of the second metatarsal cuneiform articulation.  Gait: Unassisted, Nonantalgic.    Radiographs:  Radiographs demonstrate a tight joint at the level of the second tarsometatarsal joint.  Assessment & Plan:   Assessment: Early osteoarthritic changes.   Plan: Discussed etiology pathology conservative surgical therapies at this point we are going to see if shoe gear changes will help alleviate her symptoms if not she will notify us  back.     Nastassja Witkop T. [place], [place]

## 2018-05-20 DIAGNOSIS — R740 Nonspecific elevation of levels of transaminase and lactic acid dehydrogenase [LDH]: Secondary | ICD-10-CM | POA: Diagnosis not present

## 2018-05-20 DIAGNOSIS — E782 Mixed hyperlipidemia: Secondary | ICD-10-CM | POA: Diagnosis not present

## 2018-07-24 DIAGNOSIS — Z79899 Other long term (current) drug therapy: Secondary | ICD-10-CM | POA: Diagnosis not present

## 2018-07-24 DIAGNOSIS — Z0130 Encounter for examination of blood pressure without abnormal findings: Secondary | ICD-10-CM | POA: Diagnosis not present

## 2018-07-24 DIAGNOSIS — R7301 Impaired fasting glucose: Secondary | ICD-10-CM | POA: Diagnosis not present

## 2018-07-24 DIAGNOSIS — Z139 Encounter for screening, unspecified: Secondary | ICD-10-CM | POA: Diagnosis not present

## 2018-07-24 DIAGNOSIS — E782 Mixed hyperlipidemia: Secondary | ICD-10-CM | POA: Diagnosis not present

## 2018-07-24 DIAGNOSIS — E559 Vitamin D deficiency, unspecified: Secondary | ICD-10-CM | POA: Diagnosis not present

## 2018-08-07 DIAGNOSIS — R7309 Other abnormal glucose: Secondary | ICD-10-CM | POA: Diagnosis not present

## 2018-08-07 DIAGNOSIS — R740 Nonspecific elevation of levels of transaminase and lactic acid dehydrogenase [LDH]: Secondary | ICD-10-CM | POA: Diagnosis not present

## 2018-08-07 DIAGNOSIS — E782 Mixed hyperlipidemia: Secondary | ICD-10-CM | POA: Diagnosis not present

## 2018-08-24 DIAGNOSIS — Z23 Encounter for immunization: Secondary | ICD-10-CM | POA: Diagnosis not present

## 2018-09-12 NOTE — Progress Notes (Signed)
72 y.o. G62P2002 Divorced Caucasian female here for annual exam.    No vaginal bleeding.  No vaginal discharge.  No bladder or bowel function or control problems.  Had her flu vaccine.  Did blood work at work.  Low HDL at 48.  Mildly elevated LFTs.  A1C 5.8.  Still working and enjoying this.   PCP:  [name], MD  Patient's last menstrual period was 11/20/2001 (approximate).           Sexually active: No.  The current method of family planning is post menopausal status.    Exercising: No.   Smoker:  no  Health Maintenance: Pap: 08-30-17  Neg, 07-23-15 Neg History of abnormal Pap:  Yes, Hx cryotherapy 2989 MMG: 09/2017 normal per pt.--will call [facility].    Colonoscopy: 07-22-10 normal;next 2021 BMD:  11/2017 Result :osteopenia (hx osteoporosis).  Off medication for one year.  TDaP:  01-18-12 Gardasil:   no HIV: no Hep C: no Screening Labs:  Hb today: PCP/work    reports that she has never smoked. She has never used smokeless tobacco. She reports that she does not drink alcohol or use drugs.  Past Medical History:  Diagnosis Date  . Abnormal Pap smear 1989    Cryo for abnormal pap and normal since  . Anxiety   . AVM (arteriovenous malformation) spine 2000   Right Supra clavicular  . Bursitis    left shoulder (Dr. Cay Schillings)  . Bursitis of left shoulder 03/2013  . Cellulitis   . Depression   . Elevated liver function tests 10/29/15   AST 133, ALT 131  . History of drainage of abscess 03/29/11   Wound care (rt. hand 4th digit )  . Hyperlipidemia   . Hypertension   . Osteoporosis     Past Surgical History:  Procedure Laterality Date  . CERVIX LESION DESTRUCTION  1989   CIN 1  . GYNECOLOGIC CRYOSURGERY  1989  . NASAL SINUS SURGERY  1976   also repair of nasal bone  . TUBAL LIGATION      Current Outpatient Medications  Medication Sig Dispense Refill  . amLODipine (NORVASC) 5 MG tablet Take 5 mg by mouth daily.      . benzonatate (TESSALON) 100 MG capsule  benzonatate 100 mg capsule  TAKE 1-2 CAPSULES BY MOUTH AS NEEDED THREE TIMES A DAY FOR COUGH    . CETIRIZINE HCL PO Take 1 tablet by mouth daily.    . fluticasone (FLONASE) 50 MCG/ACT nasal spray Place 2 sprays into the nose daily.    Marland Kitchen lisinopril-hydrochlorothiazide (PRINZIDE,ZESTORETIC) 20-12.5 MG per tablet Take 1 tablet by mouth daily.    Marland Kitchen omeprazole (PRILOSEC) 40 MG capsule Take 40 mg by mouth daily.  0  . risedronate (ACTONEL) 150 MG tablet Take 1 tablet by mouth every 30 (thirty) days.    . rosuvastatin (CRESTOR) 10 MG tablet Take 10 mg by mouth daily.    Marland Kitchen venlafaxine XR (EFFEXOR-XR) 75 MG 24 hr capsule TAKE 1 CAPSULE DAILY 90 capsule 0  . Vitamin D, Ergocalciferol, (DRISDOL) 50000 units CAPS capsule      No current facility-administered medications for this visit.     Family History  Problem Relation Age of Onset  . Diabetes Mother   . Heart disease Mother   . Hypertension Mother   . Heart failure Mother   . Liver disease Father   . Diabetes Father   . Diabetes Sister   . Hypertension Sister   . Breast cancer Maternal Aunt  Review of Systems  HENT:       Allergies and sinus problems  All other systems reviewed and are negative.   Exam:   BP (!) 142/98 (BP Location: Right Arm, Patient Position: Sitting, Cuff Size: Normal)   Pulse 68   Resp 14   Ht 4' 11.5" (1.511 m)   Wt 139 lb 9.6 oz (63.3 kg)   LMP 11/20/2001 (Approximate)   BMI 27.72 kg/m     General appearance: alert, cooperative and appears stated age Head: Normocephalic, without obvious abnormality, atraumatic Neck: no adenopathy, supple, symmetrical, trachea midline and thyroid normal to inspection and palpation Lungs: clear to auscultation bilaterally Breasts: normal appearance, no masses or tenderness, No nipple retraction or dimpling, No nipple discharge or bleeding, No axillary or supraclavicular adenopathy Heart: regular rate and rhythm Abdomen: soft, non-tender; no masses, no  organomegaly Extremities: extremities normal, atraumatic, no cyanosis or edema Skin: Skin color, texture, turgor normal. No rashes or lesions Lymph nodes: Cervical, supraclavicular, and axillary nodes normal. No abnormal inguinal nodes palpated Neurologic: Grossly normal  Pelvic: External genitalia:  no lesions              Urethra:  normal appearing urethra with no masses, tenderness or lesions              Bartholins and Skenes: normal                 Vagina: normal appearing vagina with normal color and discharge, no lesions              Cervix: no lesions              Pap taken: No. Bimanual Exam:  Uterus:  normal size, contour, position, consistency, mobility, non-tender              Adnexa: no mass, fullness, tenderness              Rectal exam: Yes.  .  Confirms.              Anus:  normal sphincter tone, no lesions  Chaperone was present for exam.  Assessment:   Well woman visit with normal exam. Hx chronic vulvitis.  Dermatitis of leg. Seeing dermatology.  Osteopenia.   Off Actonel. PCP managing.  Remote hx of abnormal pap.  Plan: Mammogram screening.  She will schedule. Recommended self breast awareness. Pap and HR HPV as above. Guidelines for Calcium, Vitamin D, regular exercise program including cardiovascular and weight bearing exercise. Will get a copy of her BMD from her PCP. Will get copy of mammogram from 2018 from [facility].  Follow up annually and prn.    After visit summary provided.

## 2018-09-13 ENCOUNTER — Encounter: Payer: Self-pay | Admitting: Obstetrics and Gynecology

## 2018-09-13 ENCOUNTER — Ambulatory Visit (INDEPENDENT_AMBULATORY_CARE_PROVIDER_SITE_OTHER): Payer: BLUE CROSS/BLUE SHIELD | Admitting: Obstetrics and Gynecology

## 2018-09-13 ENCOUNTER — Other Ambulatory Visit: Payer: Self-pay

## 2018-09-13 VITALS — BP 142/98 | HR 68 | Resp 14 | Ht 59.5 in | Wt 139.6 lb

## 2018-09-13 DIAGNOSIS — Z01419 Encounter for gynecological examination (general) (routine) without abnormal findings: Secondary | ICD-10-CM | POA: Diagnosis not present

## 2018-09-13 NOTE — Patient Instructions (Signed)

## 2018-09-20 DIAGNOSIS — B349 Viral infection, unspecified: Secondary | ICD-10-CM | POA: Diagnosis not present

## 2018-10-10 DIAGNOSIS — H25813 Combined forms of age-related cataract, bilateral: Secondary | ICD-10-CM | POA: Diagnosis not present

## 2018-10-15 ENCOUNTER — Other Ambulatory Visit: Payer: Self-pay | Admitting: Physician Assistant

## 2018-10-15 DIAGNOSIS — D045 Carcinoma in situ of skin of trunk: Secondary | ICD-10-CM | POA: Diagnosis not present

## 2018-10-23 DIAGNOSIS — Z1231 Encounter for screening mammogram for malignant neoplasm of breast: Secondary | ICD-10-CM | POA: Diagnosis not present

## 2018-10-31 DIAGNOSIS — D045 Carcinoma in situ of skin of trunk: Secondary | ICD-10-CM | POA: Diagnosis not present

## 2018-11-25 DIAGNOSIS — R82998 Other abnormal findings in urine: Secondary | ICD-10-CM | POA: Diagnosis not present

## 2018-11-25 DIAGNOSIS — I10 Essential (primary) hypertension: Secondary | ICD-10-CM | POA: Diagnosis not present

## 2018-11-25 DIAGNOSIS — E559 Vitamin D deficiency, unspecified: Secondary | ICD-10-CM | POA: Diagnosis not present

## 2018-11-25 DIAGNOSIS — Z0000 Encounter for general adult medical examination without abnormal findings: Secondary | ICD-10-CM | POA: Diagnosis not present

## 2018-11-28 DIAGNOSIS — H43811 Vitreous degeneration, right eye: Secondary | ICD-10-CM | POA: Diagnosis not present

## 2018-11-28 DIAGNOSIS — H25812 Combined forms of age-related cataract, left eye: Secondary | ICD-10-CM | POA: Diagnosis not present

## 2018-12-02 DIAGNOSIS — Z0000 Encounter for general adult medical examination without abnormal findings: Secondary | ICD-10-CM | POA: Diagnosis not present

## 2018-12-02 DIAGNOSIS — I10 Essential (primary) hypertension: Secondary | ICD-10-CM | POA: Diagnosis not present

## 2018-12-02 DIAGNOSIS — K219 Gastro-esophageal reflux disease without esophagitis: Secondary | ICD-10-CM | POA: Diagnosis not present

## 2018-12-02 DIAGNOSIS — E559 Vitamin D deficiency, unspecified: Secondary | ICD-10-CM | POA: Diagnosis not present

## 2018-12-02 DIAGNOSIS — N281 Cyst of kidney, acquired: Secondary | ICD-10-CM | POA: Diagnosis not present

## 2018-12-02 DIAGNOSIS — Z1389 Encounter for screening for other disorder: Secondary | ICD-10-CM | POA: Diagnosis not present

## 2018-12-11 DIAGNOSIS — Z01818 Encounter for other preprocedural examination: Secondary | ICD-10-CM | POA: Diagnosis not present

## 2018-12-26 DIAGNOSIS — H25812 Combined forms of age-related cataract, left eye: Secondary | ICD-10-CM | POA: Diagnosis not present

## 2018-12-26 DIAGNOSIS — H2512 Age-related nuclear cataract, left eye: Secondary | ICD-10-CM | POA: Diagnosis not present

## 2018-12-26 DIAGNOSIS — H2511 Age-related nuclear cataract, right eye: Secondary | ICD-10-CM | POA: Diagnosis not present

## 2019-02-05 DIAGNOSIS — R7301 Impaired fasting glucose: Secondary | ICD-10-CM | POA: Diagnosis not present

## 2019-02-05 DIAGNOSIS — Z79899 Other long term (current) drug therapy: Secondary | ICD-10-CM | POA: Diagnosis not present

## 2019-02-05 DIAGNOSIS — Z0130 Encounter for examination of blood pressure without abnormal findings: Secondary | ICD-10-CM | POA: Diagnosis not present

## 2019-02-05 DIAGNOSIS — E782 Mixed hyperlipidemia: Secondary | ICD-10-CM | POA: Diagnosis not present

## 2019-04-10 DIAGNOSIS — H2511 Age-related nuclear cataract, right eye: Secondary | ICD-10-CM | POA: Diagnosis not present

## 2019-04-10 DIAGNOSIS — H25811 Combined forms of age-related cataract, right eye: Secondary | ICD-10-CM | POA: Diagnosis not present

## 2019-06-16 DIAGNOSIS — Z008 Encounter for other general examination: Secondary | ICD-10-CM | POA: Diagnosis not present

## 2019-06-16 DIAGNOSIS — Z719 Counseling, unspecified: Secondary | ICD-10-CM | POA: Diagnosis not present

## 2019-06-16 DIAGNOSIS — R740 Nonspecific elevation of levels of transaminase and lactic acid dehydrogenase [LDH]: Secondary | ICD-10-CM | POA: Diagnosis not present

## 2019-06-16 DIAGNOSIS — E782 Mixed hyperlipidemia: Secondary | ICD-10-CM | POA: Diagnosis not present

## 2019-07-25 ENCOUNTER — Telehealth: Payer: Self-pay | Admitting: Obstetrics and Gynecology

## 2019-07-25 NOTE — Telephone Encounter (Signed)
Left message on voicemail to call and reschedule cancelled appointment. °

## 2019-08-04 DIAGNOSIS — Z79899 Other long term (current) drug therapy: Secondary | ICD-10-CM | POA: Diagnosis not present

## 2019-08-04 DIAGNOSIS — Z139 Encounter for screening, unspecified: Secondary | ICD-10-CM | POA: Diagnosis not present

## 2019-08-04 DIAGNOSIS — I10 Essential (primary) hypertension: Secondary | ICD-10-CM | POA: Diagnosis not present

## 2019-08-04 DIAGNOSIS — E559 Vitamin D deficiency, unspecified: Secondary | ICD-10-CM | POA: Diagnosis not present

## 2019-08-04 DIAGNOSIS — E782 Mixed hyperlipidemia: Secondary | ICD-10-CM | POA: Diagnosis not present

## 2019-08-04 DIAGNOSIS — R7301 Impaired fasting glucose: Secondary | ICD-10-CM | POA: Diagnosis not present

## 2019-08-13 DIAGNOSIS — E782 Mixed hyperlipidemia: Secondary | ICD-10-CM | POA: Diagnosis not present

## 2019-08-13 DIAGNOSIS — E559 Vitamin D deficiency, unspecified: Secondary | ICD-10-CM | POA: Diagnosis not present

## 2019-08-13 DIAGNOSIS — R7309 Other abnormal glucose: Secondary | ICD-10-CM | POA: Diagnosis not present

## 2019-08-13 DIAGNOSIS — R740 Nonspecific elevation of levels of transaminase and lactic acid dehydrogenase [LDH]: Secondary | ICD-10-CM | POA: Diagnosis not present

## 2019-08-23 DIAGNOSIS — Z23 Encounter for immunization: Secondary | ICD-10-CM | POA: Diagnosis not present

## 2019-09-19 ENCOUNTER — Ambulatory Visit: Payer: BLUE CROSS/BLUE SHIELD | Admitting: Obstetrics and Gynecology

## 2019-09-22 ENCOUNTER — Other Ambulatory Visit (HOSPITAL_COMMUNITY)
Admission: RE | Admit: 2019-09-22 | Discharge: 2019-09-22 | Disposition: A | Discharge status: Home / Self Care | Payer: BLUE CROSS/BLUE SHIELD | Source: Ambulatory Visit | Attending: Obstetrics and Gynecology | Admitting: Obstetrics and Gynecology

## 2019-09-22 ENCOUNTER — Encounter: Payer: Self-pay | Admitting: Obstetrics and Gynecology

## 2019-09-22 ENCOUNTER — Other Ambulatory Visit: Payer: Self-pay

## 2019-09-22 ENCOUNTER — Ambulatory Visit: Payer: BC Managed Care – PPO | Admitting: Obstetrics and Gynecology

## 2019-09-22 VITALS — BP 170/90 | HR 70 | Temp 98.0°F | Resp 12 | Ht 59.5 in | Wt 145.0 lb

## 2019-09-22 DIAGNOSIS — Z01419 Encounter for gynecological examination (general) (routine) without abnormal findings: Secondary | ICD-10-CM

## 2019-09-22 NOTE — Patient Instructions (Signed)

## 2019-09-22 NOTE — Progress Notes (Signed)
73 y.o. G29P2002 Divorced Caucasian female here for annual exam.    Denies vaginal bleeding, spotting, pelvic pain, bladder or bowel problems.  Having problems with left carpal tunnel.   Is still working and is back in the office.  She works for [employer]  Had 2 great grandchildren.   PCP: [name], MD  Patient's last menstrual period was 11/20/2001 (approximate).           Sexually active: No.  The current method of family planning is post menopausal status.    Exercising: No.  trying to get back in on treadmill and walking Smoker:  no  Health Maintenance: Pap:  08-30-17  Neg, 07-23-15 Neg History of abnormal Pap:  Yes, Hx cryotherapy 1989 [id number] states normal 10/2018(req.report) and has appt.10/2019. 10-22-17 Neg/density [id number] Colonoscopy: 07-22-10 normal;next 2021 BMD: 11/2017  Result :Osteopenia(hx Osteoporosis) Off medication.  Appt. 2021 with PCP TDaP:  01-18-12 Gardasil:   no HIV:no Hep C:no Screening Labs:  PCP.  Flu vaccine:  Completed.   reports that she has never smoked. She has never used smokeless tobacco. She reports that she does not drink alcohol or use drugs.  Past Medical History:  Diagnosis Date  . Abnormal Pap smear 1989    Cryo for abnormal pap and normal since  . Anxiety   . AVM (arteriovenous malformation) spine 2000   Right Supra clavicular  . Bursitis    left shoulder (Dr. Cay Schillings)  . Bursitis of left shoulder 03/2013  . Cellulitis   . Depression   . Elevated liver function tests 10/29/15   AST 133, ALT 131  . History of drainage of abscess 03/29/11   Wound care (rt. hand 4th digit )  . Hyperlipidemia   . Hypertension   . Osteoporosis     Past Surgical History:  Procedure Laterality Date  . CERVIX LESION DESTRUCTION  1989   CIN 1  . GYNECOLOGIC CRYOSURGERY  1989  . NASAL SINUS SURGERY  1976   also repair of nasal bone  . precancerous lesion removal  2019   under right breast by dermatologist  . TUBAL LIGATION      Current  Outpatient Medications  Medication Sig Dispense Refill  . amLODipine (NORVASC) 5 MG tablet Take 5 mg by mouth daily.      Marland Kitchen CETIRIZINE HCL PO Take 1 tablet by mouth daily.    . fluticasone (FLONASE) 50 MCG/ACT nasal spray Place 2 sprays into the nose daily.    Marland Kitchen lisinopril-hydrochlorothiazide (PRINZIDE,ZESTORETIC) 20-12.5 MG per tablet Take 1 tablet by mouth daily.    . pantoprazole (PROTONIX) 40 MG tablet Take 40 mg by mouth daily.    . rosuvastatin (CRESTOR) 5 MG tablet Take 5 mg by mouth daily.    Marland Kitchen venlafaxine XR (EFFEXOR-XR) 75 MG 24 hr capsule TAKE 1 CAPSULE DAILY 90 capsule 0  . Vitamin D, Ergocalciferol, (DRISDOL) 50000 units CAPS capsule      No current facility-administered medications for this visit.     Family History  Problem Relation Age of Onset  . Diabetes Mother   . Heart disease Mother   . Hypertension Mother   . Heart failure Mother   . Liver disease Father   . Diabetes Father   . Diabetes Sister   . Hypertension Sister   . Breast cancer Maternal Aunt     Review of Systems  All other systems reviewed and are negative.   Exam:   BP (!) 170/90 (Cuff Size: Large)   Pulse 70  Temp 98 F (36.7 C) (Temporal)   Resp 12   Ht 4' 11.5" (1.511 m)   Wt 145 lb (65.8 kg)   LMP 11/20/2001 (Approximate)   BMI 28.80 kg/m     General appearance: alert, cooperative and appears stated age Head: normocephalic, without obvious abnormality, atraumatic Neck: no adenopathy, supple, symmetrical, trachea midline and thyroid normal to inspection and palpation Lungs: clear to auscultation bilaterally Breasts: normal appearance, no masses or tenderness, No nipple retraction or dimpling, No nipple discharge or bleeding, No axillary adenopathy Heart: regular rate and rhythm Abdomen: soft, non-tender; no masses, no organomegaly Extremities: extremities normal, atraumatic, no cyanosis or edema Skin: skin color, texture, turgor normal. No rashes or lesions Lymph nodes: cervical,  supraclavicular, and axillary nodes normal. Neurologic: grossly normal  Pelvic: External genitalia:  Left vulva with scar and Castner coloration of the tissue at the vestibule.  No raised areas.              No abnormal inguinal nodes palpated.              Urethra:  normal appearing urethra with no masses, tenderness or lesions              Bartholins and Skenes: normal                 Vagina: normal appearing vagina with normal color and discharge, no lesions              Cervix: no lesions              Pap taken: Yes.   Bimanual Exam:  Uterus:  normal size, contour, position, consistency, mobility, non-tender              Adnexa: no mass, fullness, tenderness              Rectal exam: Yes.  .  Confirms.              Anus:  normal sphincter tone, no lesions  Chaperone was present for exam.  Assessment:   Well woman visit with normal exam. Hx chronic vulvitis. Left vulvar hypopigmentation.  Biopsy shows hyperkeratosis and spongiotic dermatitis.  Dermatitis of leg. Seeing dermatology.  Osteopenia.  Off Actonel. PCP managing.  Remote hx of abnormal pap. Elevated blood pressure.   Plan: Mammogram screening discussed. Self breast awareness reviewed. Pap and HR HPV as above. Guidelines for Calcium, Vitamin D, regular exercise program including cardiovascular and weight bearing exercise. She will check her BP at work daily and call her PCP if it is elevated.  BMD with PCP in January, 2021.  Follow up annually and prn.   After visit summary provided.

## 2019-09-23 LAB — CYTOLOGY - PAP: Diagnosis: NEGATIVE

## 2019-10-28 ENCOUNTER — Encounter: Payer: Self-pay | Admitting: Obstetrics and Gynecology

## 2019-12-10 DIAGNOSIS — M810 Age-related osteoporosis without current pathological fracture: Secondary | ICD-10-CM | POA: Diagnosis not present

## 2019-12-16 DIAGNOSIS — Z0000 Encounter for general adult medical examination without abnormal findings: Secondary | ICD-10-CM | POA: Diagnosis not present

## 2019-12-16 DIAGNOSIS — M810 Age-related osteoporosis without current pathological fracture: Secondary | ICD-10-CM | POA: Diagnosis not present

## 2019-12-16 DIAGNOSIS — E7849 Other hyperlipidemia: Secondary | ICD-10-CM | POA: Diagnosis not present

## 2019-12-17 DIAGNOSIS — R7401 Elevation of levels of liver transaminase levels: Secondary | ICD-10-CM | POA: Diagnosis not present

## 2019-12-17 DIAGNOSIS — M810 Age-related osteoporosis without current pathological fracture: Secondary | ICD-10-CM | POA: Diagnosis not present

## 2019-12-17 DIAGNOSIS — E785 Hyperlipidemia, unspecified: Secondary | ICD-10-CM | POA: Diagnosis not present

## 2019-12-17 DIAGNOSIS — N281 Cyst of kidney, acquired: Secondary | ICD-10-CM | POA: Diagnosis not present

## 2019-12-17 DIAGNOSIS — R69 Illness, unspecified: Secondary | ICD-10-CM | POA: Diagnosis not present

## 2019-12-17 DIAGNOSIS — Z1331 Encounter for screening for depression: Secondary | ICD-10-CM | POA: Diagnosis not present

## 2019-12-17 DIAGNOSIS — E559 Vitamin D deficiency, unspecified: Secondary | ICD-10-CM | POA: Diagnosis not present

## 2019-12-17 DIAGNOSIS — Z0000 Encounter for general adult medical examination without abnormal findings: Secondary | ICD-10-CM | POA: Diagnosis not present

## 2019-12-17 DIAGNOSIS — I10 Essential (primary) hypertension: Secondary | ICD-10-CM | POA: Diagnosis not present

## 2019-12-17 DIAGNOSIS — K219 Gastro-esophageal reflux disease without esophagitis: Secondary | ICD-10-CM | POA: Diagnosis not present

## 2020-02-11 ENCOUNTER — Encounter: Payer: Self-pay | Admitting: Certified Nurse Midwife

## 2020-09-04 DIAGNOSIS — Z23 Encounter for immunization: Secondary | ICD-10-CM | POA: Diagnosis not present

## 2020-10-18 DIAGNOSIS — M13832 Other specified arthritis, left wrist: Secondary | ICD-10-CM | POA: Diagnosis not present

## 2020-10-18 DIAGNOSIS — M79642 Pain in left hand: Secondary | ICD-10-CM | POA: Diagnosis not present

## 2020-10-18 DIAGNOSIS — M1812 Unilateral primary osteoarthritis of first carpometacarpal joint, left hand: Secondary | ICD-10-CM | POA: Diagnosis not present

## 2020-10-18 DIAGNOSIS — G5602 Carpal tunnel syndrome, left upper limb: Secondary | ICD-10-CM | POA: Diagnosis not present

## 2020-10-28 DIAGNOSIS — Z1231 Encounter for screening mammogram for malignant neoplasm of breast: Secondary | ICD-10-CM | POA: Diagnosis not present

## 2020-11-02 NOTE — Progress Notes (Signed)
74 y.o. G25P2002 Divorced Caucasian female here for annual exam.    Patient to see Dr.Holwerda 12/2020 for CPE.  No vaginal bleeding, discharge, or bowel concerns.  She voids more frequently.  No urinary incontinence.  Can get up zero to twice a night.   No vulvar itching for 2 -3 months.  She does not use any treatment.   She had a precancerous area removed from the skin of her right breast.   Is still working.   Received her Covid booster and flu vaccine.   PCP: [name], MD    Patient's last menstrual period was 11/20/2001 (approximate).           Sexually active: No.  The current method of family planning is post menopausal status.    Exercising: No.  The patient does not participate in regular exercise at present. Smoker:  no  Health Maintenance: Pap: 09-22-19 Neg, 08-30-17 Neg, 07-23-15 Neg History of abnormal Pap:  Yes,  Hx cryotherapy 1989 MMG: Last week with [facility]--normal per pt. Colonoscopy:  07-22-10 normal;next 2021--Pt.knows it's time to schedule BMD: 11/2017  Result :Osteopenia(hx Osteoporosis). Off medication.   TDaP: 01-18-12 Gardasil:   no HIV:no Hep C:no Screening Labs:  PCP.    reports that she has never smoked. She has never used smokeless tobacco. She reports that she does not drink alcohol and does not use drugs.  Past Medical History:  Diagnosis Date  . Abnormal Pap smear 1989    Cryo for abnormal pap and normal since  . Anxiety   . AVM (arteriovenous malformation) spine 2000   Right Supra clavicular  . Bursitis    left shoulder (Dr. Cay Schillings)  . Bursitis of left shoulder 03/2013  . Cellulitis   . Depression   . Elevated liver function tests 10/29/15   AST 133, ALT 131  . History of drainage of abscess 03/29/11   Wound care (rt. hand 4th digit )  . Hyperlipidemia   . Hypertension   . Osteoporosis     Past Surgical History:  Procedure Laterality Date  . CERVIX LESION DESTRUCTION  1989   CIN 1  . GYNECOLOGIC CRYOSURGERY  1989  .  NASAL SINUS SURGERY  1976   also repair of nasal bone  . precancerous lesion removal  2019   under right breast by dermatologist  . TUBAL LIGATION      Current Outpatient Medications  Medication Sig Dispense Refill  . amLODipine (NORVASC) 5 MG tablet Take 5 mg by mouth daily.    Marland Kitchen CETIRIZINE HCL PO Take 1 tablet by mouth daily.    . famotidine (PEPCID) 20 MG tablet Take 20 mg by mouth daily.    . fluticasone (FLONASE) 50 MCG/ACT nasal spray Place 2 sprays into the nose daily.    Marland Kitchen lisinopril-hydrochlorothiazide (PRINZIDE,ZESTORETIC) 20-12.5 MG per tablet Take 1 tablet by mouth daily.    . pantoprazole (PROTONIX) 40 MG tablet Take 40 mg by mouth daily.    . rosuvastatin (CRESTOR) 5 MG tablet Take 5 mg by mouth daily.    Marland Kitchen venlafaxine XR (EFFEXOR-XR) 75 MG 24 hr capsule TAKE 1 CAPSULE DAILY 90 capsule 0  . Vitamin D, Ergocalciferol, (DRISDOL) 50000 units CAPS capsule      No current facility-administered medications for this visit.    Family History  Problem Relation Age of Onset  . Diabetes Mother   . Heart disease Mother   . Hypertension Mother   . Heart failure Mother   . Liver disease Father   .  Diabetes Father   . Diabetes Sister   . Hypertension Sister   . Breast cancer Maternal Aunt     Review of Systems  All other systems reviewed and are negative.   Exam:   BP (!) 148/74 (Cuff Size: Large)   Pulse 70   Ht 4' 11.5" (1.511 m)   Wt 145 lb (65.8 kg)   LMP 11/20/2001 (Approximate)   SpO2 90%   BMI 28.80 kg/m     General appearance: alert, cooperative and appears stated age Head: normocephalic, without obvious abnormality, atraumatic Neck: no adenopathy, supple, symmetrical, trachea midline and thyroid normal to inspection and palpation Lungs: clear to auscultation bilaterally Breasts: normal appearance, no masses or tenderness, No nipple retraction or dimpling, No nipple discharge or bleeding, No axillary adenopathy Heart: regular rate and rhythm Abdomen: soft,  non-tender; no masses, no organomegaly Extremities: extremities normal, atraumatic, no cyanosis or edema Skin: skin color, texture, turgor normal. No rashes or lesions Lymph nodes: cervical, supraclavicular, and axillary nodes normal. Neurologic: grossly normal  Pelvic: External genitalia:  no lesions              No abnormal inguinal nodes palpated.              Urethra:  normal appearing urethra with no masses, tenderness or lesions              Bartholins and Skenes: normal                 Vagina: normal appearing vagina with normal color and discharge, no lesions              Cervix: no lesions              Pap taken: No. Bimanual Exam:  Uterus:  normal size, contour, position, consistency, mobility, non-tender              Adnexa: no mass, fullness, tenderness              Rectal exam: Yes.  .  Confirms.              Anus:  normal sphincter tone, no lesions  Chaperone was present for exam.  Assessment:   Well woman visit with normal exam. Hx chronic vulvitis.  Biopsy shows hyperkeratosis and spongiotic dermatitis.  Osteopenia. OffActonel. PCP managing.  Remote hx of abnormal pap.  Plan: Mammogram screening discussed. Self breast awareness reviewed. Pap next year.  Guidelines for Calcium, Vitamin D, regular exercise program including cardiovascular and weight bearing exercise. Labs with PCP.  She will discuss BMD with her PCP. Follow up annually and prn.

## 2020-11-03 ENCOUNTER — Other Ambulatory Visit: Payer: Self-pay

## 2020-11-03 ENCOUNTER — Encounter: Payer: Self-pay | Admitting: Obstetrics and Gynecology

## 2020-11-03 ENCOUNTER — Ambulatory Visit (INDEPENDENT_AMBULATORY_CARE_PROVIDER_SITE_OTHER): Payer: Medicare HMO | Admitting: Obstetrics and Gynecology

## 2020-11-03 VITALS — BP 148/74 | HR 70 | Ht 59.5 in | Wt 145.0 lb

## 2020-11-03 DIAGNOSIS — Z01419 Encounter for gynecological examination (general) (routine) without abnormal findings: Secondary | ICD-10-CM | POA: Diagnosis not present

## 2020-11-03 NOTE — Patient Instructions (Signed)

## 2020-11-09 ENCOUNTER — Telehealth: Payer: Self-pay

## 2020-11-09 NOTE — Telephone Encounter (Signed)
Previous [facility] pt in for a recall colon with Dr. Tarri Glenn. Please see note below, pt wants to know if she needs procedure done at the hospital. Please advise.

## 2020-11-09 NOTE — Telephone Encounter (Signed)
Patient is aware and scheduled

## 2020-11-09 NOTE — Telephone Encounter (Signed)
Prior records reviewed. She failed conscious sedation and had to have the the procedure at the hospital for [place]. However, all procedures at the [facility] are now performed with MAC anesthesia. She does not need to incur the cost or inconvenience of a hospital procedure this time.

## 2020-11-09 NOTE — Telephone Encounter (Signed)
I am working on recalls. I contacted this patient who is a former Dr. Olevia Perches patient. She states she is supposed to have her colonoscopy at the hospital because, Dr. Olevia Perches told her she was unable to get through her colon in an outpatient setting. They had trouble with every type of anaesthesia. Patient would like for this to be reevaluated.

## 2020-12-20 DIAGNOSIS — Z0000 Encounter for general adult medical examination without abnormal findings: Secondary | ICD-10-CM | POA: Diagnosis not present

## 2020-12-20 DIAGNOSIS — I10 Essential (primary) hypertension: Secondary | ICD-10-CM | POA: Diagnosis not present

## 2020-12-20 DIAGNOSIS — E559 Vitamin D deficiency, unspecified: Secondary | ICD-10-CM | POA: Diagnosis not present

## 2020-12-20 DIAGNOSIS — E785 Hyperlipidemia, unspecified: Secondary | ICD-10-CM | POA: Diagnosis not present

## 2020-12-21 DIAGNOSIS — Z79899 Other long term (current) drug therapy: Secondary | ICD-10-CM | POA: Diagnosis not present

## 2020-12-21 DIAGNOSIS — E785 Hyperlipidemia, unspecified: Secondary | ICD-10-CM | POA: Diagnosis not present

## 2020-12-24 DIAGNOSIS — I10 Essential (primary) hypertension: Secondary | ICD-10-CM | POA: Diagnosis not present

## 2020-12-24 DIAGNOSIS — E785 Hyperlipidemia, unspecified: Secondary | ICD-10-CM | POA: Diagnosis not present

## 2020-12-24 DIAGNOSIS — E559 Vitamin D deficiency, unspecified: Secondary | ICD-10-CM | POA: Diagnosis not present

## 2020-12-24 DIAGNOSIS — R232 Flushing: Secondary | ICD-10-CM | POA: Diagnosis not present

## 2020-12-24 DIAGNOSIS — R82998 Other abnormal findings in urine: Secondary | ICD-10-CM | POA: Diagnosis not present

## 2020-12-24 DIAGNOSIS — K760 Fatty (change of) liver, not elsewhere classified: Secondary | ICD-10-CM | POA: Diagnosis not present

## 2020-12-24 DIAGNOSIS — K219 Gastro-esophageal reflux disease without esophagitis: Secondary | ICD-10-CM | POA: Diagnosis not present

## 2020-12-24 DIAGNOSIS — M810 Age-related osteoporosis without current pathological fracture: Secondary | ICD-10-CM | POA: Diagnosis not present

## 2020-12-24 DIAGNOSIS — Z0000 Encounter for general adult medical examination without abnormal findings: Secondary | ICD-10-CM | POA: Diagnosis not present

## 2020-12-24 DIAGNOSIS — N281 Cyst of kidney, acquired: Secondary | ICD-10-CM | POA: Diagnosis not present

## 2021-01-07 ENCOUNTER — Ambulatory Visit (AMBULATORY_SURGERY_CENTER): Payer: Self-pay | Admitting: *Deleted

## 2021-01-07 ENCOUNTER — Other Ambulatory Visit: Payer: Self-pay

## 2021-01-07 VITALS — Ht 59.5 in | Wt 144.0 lb

## 2021-01-07 DIAGNOSIS — Z1211 Encounter for screening for malignant neoplasm of colon: Secondary | ICD-10-CM

## 2021-01-07 MED ORDER — SUTAB 1479-225-188 MG PO TABS: 24.0000 | ORAL_TABLET | ORAL | 0 refills | Status: DC

## 2021-01-07 NOTE — Progress Notes (Signed)
No egg or soy allergy known to patient  No issues with past sedation with any surgeries or procedures No intubation problems in the past  No FH of Malignant Hyperthermia No diet pills per patient No home 02 use per patient  No blood thinners per patient  Pt denies issues with constipation  No A fib or A flutter  EMMI video to pt or via [place] 19 guidelines implemented in PV today with Pt and RN  Pt is fully vaccinated  for [employer]  Activated on line and given to pt in PV today , Code to Pharmacy and  NO PA's for preps discussed with pt In PV today  Discussed with pt there will be an out-of-pocket cost for prep and that varies from $0 to 70 dollars   Due to the COVID-19 pandemic we are asking patients to follow certain guidelines.  Pt aware of COVID protocols and [facility] guidelines

## 2021-01-12 ENCOUNTER — Encounter: Payer: Self-pay | Admitting: Gastroenterology

## 2021-01-14 ENCOUNTER — Other Ambulatory Visit: Payer: Self-pay

## 2021-01-14 ENCOUNTER — Encounter: Payer: Self-pay | Admitting: Gastroenterology

## 2021-01-14 ENCOUNTER — Ambulatory Visit (AMBULATORY_SURGERY_CENTER): Payer: Medicare HMO | Admitting: Gastroenterology

## 2021-01-14 VITALS — BP 133/71 | HR 66 | Temp 97.5°F | Resp 11 | Ht 59.0 in | Wt 144.0 lb

## 2021-01-14 DIAGNOSIS — D120 Benign neoplasm of cecum: Secondary | ICD-10-CM | POA: Diagnosis not present

## 2021-01-14 DIAGNOSIS — Z86010 Personal history of colonic polyps: Secondary | ICD-10-CM | POA: Diagnosis not present

## 2021-01-14 DIAGNOSIS — Z1211 Encounter for screening for malignant neoplasm of colon: Secondary | ICD-10-CM

## 2021-01-14 DIAGNOSIS — D123 Benign neoplasm of transverse colon: Secondary | ICD-10-CM

## 2021-01-14 MED ORDER — SODIUM CHLORIDE 0.9 % IV SOLN: 500.0000 mL | INTRAVENOUS | Status: DC

## 2021-01-14 NOTE — Op Note (Signed)
[place] Patient Name: Joanna Cohen Procedure Date: 01/14/2021 8:00 AM MRN: [id number] Endoscopist: Thornton Park MD, MD Age: 75 Referring MD:  Date of Birth: 1946-04-22 Gender: Female Account #: [id number] Procedure:                Colonoscopy Indications:              Screening for colorectal malignant neoplasm                           Tubular adenoma on colonoscopy 2006                           No polyps on colonoscopy 2011                           No known family history of colon cancer or polyps Medicines:                Monitored Anesthesia Care Procedure:                Pre-Anesthesia Assessment:                           - Prior to the procedure, a History and Physical                            was performed, and patient medications and                            allergies were reviewed. The patient's tolerance of                            previous anesthesia was also reviewed. The risks                            and benefits of the procedure and the sedation                            options and risks were discussed with the patient.                            All questions were answered, and informed consent                            was obtained. Prior Anticoagulants: The patient has                            taken no previous anticoagulant or antiplatelet                            agents. ASA Grade Assessment: III - A patient with                            severe systemic disease. After reviewing the risks  and benefits, the patient was deemed in                            satisfactory condition to undergo the procedure.                           After obtaining informed consent, the colonoscope                            was passed under direct vision. Throughout the                            procedure, the patient's blood pressure, pulse, and                            oxygen saturations were monitored continuously. The                             Olympus PCF-H190DL (#[id number]) Colonoscope was                            introduced through the anus and advanced to the 3                            cm into the ileum. A second forward view of the                            right colon was performed. The colonoscopy was                            performed without difficulty. The patient tolerated                            the procedure well. The quality of the bowel                            preparation was good. The terminal ileum, ileocecal                            valve, appendiceal orifice, and rectum were                            photographed. Scope In: 8:09:58 AM Scope Out: 8:23:39 AM Scope Withdrawal Time: 0 hours 10 minutes 28 seconds  Total Procedure Duration: 0 hours 13 minutes 41 seconds  Findings:                 The perianal and digital rectal examinations were                            normal.                           A few small and large-mouthed diverticula were  found in the sigmoid colon, descending colon and                            ascending colon.                           A 3 mm polyp was found in the hepatic flexure. The                            polyp was flat. The polyp was removed with a cold                            snare. Resection and retrieval were complete.                            Estimated blood loss was minimal.                           A 1 mm polyp was found in the cecum. The polyp was                            flat. The polyp was removed with a cold snare.                            Resection and retrieval were complete. Estimated                            blood loss was minimal.                           The exam was otherwise without abnormality on                            direct and retroflexion views. Complications:            No immediate complications. Estimated Blood Loss:     Estimated blood loss was  minimal. Impression:               - Diverticulosis in the sigmoid colon, in the                            descending colon and in the ascending colon.                           - One 3 mm polyp at the hepatic flexure, removed                            with a cold snare. Resected and retrieved.                           - One 1 mm polyp in the cecum, removed with a cold                            snare. Resected  and retrieved.                           - The examination was otherwise normal on direct                            and retroflexion views. Recommendation:           - Patient has a contact number available for                            emergencies. The signs and symptoms of potential                            delayed complications were discussed with the                            patient. Return to normal activities tomorrow.                            Written discharge instructions were provided to the                            patient.                           - High fiber diet.                           - Continue present medications.                           - Await pathology results.                           - Repeat colonoscopy date to be determined after                            pending pathology results are reviewed for                            surveillance. Consider surveillance colonoscopy in                            the future if clinically appropriate at that time.                           - Emerging evidence supports eating a diet of                            fruits, vegetables, grains, calcium, and yogurt                            while reducing red meat and alcohol may reduce the  risk of colon cancer.                           - Thank you for allowing me to be involved in your                            colon cancer prevention. Thornton Park MD, MD 01/14/2021 8:29:58 AM This report has been signed electronically.

## 2021-01-14 NOTE — Progress Notes (Signed)
Called to room to assist during endoscopic procedure.  Patient ID and intended procedure confirmed with present staff. Received instructions for my participation in the procedure from the performing physician.  

## 2021-01-14 NOTE — Patient Instructions (Signed)
YOU HAD AN ENDOSCOPIC PROCEDURE TODAY AT THE [facility]:   Refer to the procedure report that was given to you for any specific questions about what was found during the examination.  If the procedure report does not answer your questions, please call your gastroenterologist to clarify.  If you requested that your care partner not be given the details of your procedure findings, then the procedure report has been included in a sealed envelope for you to review at your convenience later.  YOU SHOULD EXPECT: Some feelings of bloating in the abdomen. Passage of more gas than usual.  Walking can help get rid of the air that was put into your GI tract during the procedure and reduce the bloating. If you had a lower endoscopy (such as a colonoscopy or flexible sigmoidoscopy) you may notice spotting of blood in your stool or on the toilet paper. If you underwent a bowel prep for your procedure, you may not have a normal bowel movement for a few days.  Please Note:  You might notice some irritation and congestion in your nose or some drainage.  This is from the oxygen used during your procedure.  There is no need for concern and it should clear up in a day or so.  SYMPTOMS TO REPORT IMMEDIATELY:   Following lower endoscopy (colonoscopy or flexible sigmoidoscopy):  Excessive amounts of blood in the stool  Significant tenderness or worsening of abdominal pains  Swelling of the abdomen that is new, acute  Fever of 100F or higher   Following upper endoscopy (EGD)  Vomiting of blood or coffee ground material  New chest pain or pain under the shoulder blades  Painful or persistently difficult swallowing  New shortness of breath  Fever of 100F or higher  Black, tarry-looking stools  For urgent or emergent issues, a gastroenterologist can be reached at any hour by calling [phone]. Do not use MyChart messaging for urgent concerns.    DIET:  We do recommend a small meal at first, but  then you may proceed to your regular diet.  Drink plenty of fluids but you should avoid alcoholic beverages for 24 hours.  ACTIVITY:  You should plan to take it easy for the rest of today and you should NOT DRIVE or use heavy machinery until tomorrow (because of the sedation medicines used during the test).    FOLLOW UP: Our staff will call the number listed on your records 48-72 hours following your procedure to check on you and address any questions or concerns that you may have regarding the information given to you following your procedure. If we do not reach you, we will leave a message.  We will attempt to reach you two times.  During this call, we will ask if you have developed any symptoms of COVID 19. If you develop any symptoms (ie: fever, flu-like symptoms, shortness of breath, cough etc.) before then, please call [phone].  If you test positive for Covid 19 in the 2 weeks post procedure, please call and report this information to us.    If any biopsies were taken you will be contacted by phone or by letter within the next 1-3 weeks.  Please call us at [phone] if you have not heard about the biopsies in 3 weeks.    SIGNATURES/CONFIDENTIALITY: You and/or your care partner have signed paperwork which will be entered into your electronic medical record.  These signatures attest to the fact that that the information above on   your After Visit Summary has been reviewed and is understood.  Full responsibility of the confidentiality of this discharge information lies with you and/or your care-partner. 

## 2021-01-14 NOTE — Progress Notes (Signed)
pt tolerated well. VSS. awake and to recovery. Report given to RN.  

## 2021-01-18 ENCOUNTER — Telehealth: Payer: Self-pay | Admitting: *Deleted

## 2021-01-18 NOTE — Telephone Encounter (Signed)
  Follow up Call-  Call back number 01/14/2021  Post procedure Call Back phone  # [phone]  Permission to leave phone message Yes  Some recent data might be hidden     Patient questions:  Do you have a fever, pain , or abdominal swelling? No. Pain Score  0 *  Have you tolerated food without any problems? Yes.    Have you been able to return to your normal activities? Yes.    Do you have any questions about your discharge instructions: Diet   No. Medications  No. Follow up visit  No.  Do you have questions or concerns about your Care? No.  Actions: * If pain score is 4 or above: No action needed, pain <4.  1. Have you developed a fever since your procedure? no  2.   Have you had an respiratory symptoms (SOB or cough) since your procedure? no  3.   Have you tested positive for COVID 19 since your procedure no  4.   Have you had any family members/close contacts diagnosed with the COVID 19 since your procedure?  no   If yes to any of these questions please route to Joylene John, RN and Joella Prince, RN

## 2021-01-27 ENCOUNTER — Encounter: Payer: Self-pay | Admitting: Gastroenterology

## 2021-11-03 DIAGNOSIS — Z1231 Encounter for screening mammogram for malignant neoplasm of breast: Secondary | ICD-10-CM | POA: Diagnosis not present

## 2021-12-14 ENCOUNTER — Ambulatory Visit: Payer: Medicare HMO | Admitting: Obstetrics and Gynecology

## 2021-12-20 ENCOUNTER — Ambulatory Visit: Payer: Self-pay | Admitting: Obstetrics and Gynecology

## 2021-12-26 DIAGNOSIS — I10 Essential (primary) hypertension: Secondary | ICD-10-CM | POA: Diagnosis not present

## 2021-12-26 DIAGNOSIS — E785 Hyperlipidemia, unspecified: Secondary | ICD-10-CM | POA: Diagnosis not present

## 2021-12-26 DIAGNOSIS — M810 Age-related osteoporosis without current pathological fracture: Secondary | ICD-10-CM | POA: Diagnosis not present

## 2021-12-26 DIAGNOSIS — Z79899 Other long term (current) drug therapy: Secondary | ICD-10-CM | POA: Diagnosis not present

## 2021-12-26 DIAGNOSIS — E559 Vitamin D deficiency, unspecified: Secondary | ICD-10-CM | POA: Diagnosis not present

## 2022-01-03 DIAGNOSIS — E785 Hyperlipidemia, unspecified: Secondary | ICD-10-CM | POA: Diagnosis not present

## 2022-01-03 DIAGNOSIS — G5602 Carpal tunnel syndrome, left upper limb: Secondary | ICD-10-CM | POA: Diagnosis not present

## 2022-01-03 DIAGNOSIS — K760 Fatty (change of) liver, not elsewhere classified: Secondary | ICD-10-CM | POA: Diagnosis not present

## 2022-01-03 DIAGNOSIS — K219 Gastro-esophageal reflux disease without esophagitis: Secondary | ICD-10-CM | POA: Diagnosis not present

## 2022-01-03 DIAGNOSIS — I10 Essential (primary) hypertension: Secondary | ICD-10-CM | POA: Diagnosis not present

## 2022-01-03 DIAGNOSIS — R7303 Prediabetes: Secondary | ICD-10-CM | POA: Diagnosis not present

## 2022-01-03 DIAGNOSIS — M5431 Sciatica, right side: Secondary | ICD-10-CM | POA: Diagnosis not present

## 2022-01-03 DIAGNOSIS — M810 Age-related osteoporosis without current pathological fracture: Secondary | ICD-10-CM | POA: Diagnosis not present

## 2022-01-03 DIAGNOSIS — Z1331 Encounter for screening for depression: Secondary | ICD-10-CM | POA: Diagnosis not present

## 2022-01-03 DIAGNOSIS — Z0000 Encounter for general adult medical examination without abnormal findings: Secondary | ICD-10-CM | POA: Diagnosis not present

## 2022-01-03 DIAGNOSIS — Z1339 Encounter for screening examination for other mental health and behavioral disorders: Secondary | ICD-10-CM | POA: Diagnosis not present

## 2022-01-03 DIAGNOSIS — E559 Vitamin D deficiency, unspecified: Secondary | ICD-10-CM | POA: Diagnosis not present

## 2022-02-08 NOTE — Progress Notes (Signed)
76 y.o. G22P2002 Divorced Caucasian female here for breast and pelvic exam.   ? ?Once in a while has vulvar itching.  ?Has a flare twice a year. ?Does not use medication.  ? ?Not wearing pads.  ? ?No discharge.  ? ?No bleeding.  ? ?Not sexually active.  ? ?Still working for [employer] ?Family is living living with her.  ? ?PCP: [name] MD ? ?Patient's last menstrual period was 11/20/2001 (approximate).     ?  ?    ?Sexually active: No.  ?The current method of family planning is Tubal/post menopausal status.    ?Exercising: No.  The patient does not participate in regular exercise at present. ?Smoker:  no ? ?Health Maintenance: ?Pap:   09-22-19 Neg, 08-30-17  Neg, 07-23-15 Neg ?History of abnormal Pap:  Yes, Hx cryotherapy 1989 ?MMG:  10/2021 normal --[facility] ?Colonoscopy:  01-14-21 polyps removed ?BMD: 12/2021 Result :Osteopenia--w/PCP ?TDaP:  PCP ?Gardasil:   no ?HIV:no ?Hep C:no ?Screening Labs:  PCP ? ? reports that she has never smoked. She has never used smokeless tobacco. She reports that she does not drink alcohol and does not use drugs. ? ?Past Medical History:  ?Diagnosis Date  ? Abnormal Pap smear 1989  ?  Cryo for abnormal pap and normal since  ? Allergy   ? Anxiety   ? Arthritis   ? AVM (arteriovenous malformation) spine 2000  ? Right Supra clavicular  ? Bursitis   ? left shoulder (Dr. Cay Schillings)  ? Bursitis of left shoulder 03/2013  ? Cancer [facility])   ? skin cancer   ? Cataract   ? removed both   ? Cellulitis   ? Depression   ? Elevated liver function tests 10/29/15  ? AST 133, ALT 131  ? GERD (gastroesophageal reflux disease)   ? History of drainage of abscess 03/29/11  ? Wound care (rt. hand 4th digit )  ? Hyperlipidemia   ? Hypertension   ? Osteoporosis   ? ? ?Past Surgical History:  ?Procedure Laterality Date  ? CATARACT EXTRACTION, BILATERAL    ? [place]  ? CIN 1  ? COLONOSCOPY    ? [place]  ? NASAL SINUS SURGERY  1976  ? also repair of nasal bone  ? POLYPECTOMY     ? TA 2006, 2011 colon normal Olevia Perches said 10 yr recall   ? precancerous lesion removal  2019  ? under right breast by dermatologist  ? [place]    ? ? ?Current Outpatient Medications  ?Medication Sig Dispense Refill  ? amLODipine (NORVASC) 5 MG tablet Take 5 mg by mouth daily.    ? CETIRIZINE HCL PO Take 1 tablet by mouth daily.    ? famotidine (PEPCID) 20 MG tablet Take 20 mg by mouth daily.    ? fluticasone (FLONASE) 50 MCG/ACT nasal spray Place 2 sprays into the nose daily.    ? ibuprofen (ADVIL) 200 MG tablet Take 200 mg by mouth every 6 (six) hours as needed.    ? lisinopril-hydrochlorothiazide (PRINZIDE,ZESTORETIC) 20-12.5 MG per tablet Take 1 tablet by mouth daily.    ? pantoprazole (PROTONIX) 40 MG tablet Take 40 mg by mouth daily.    ? rosuvastatin (CRESTOR) 5 MG tablet Take 5 mg by mouth daily.    ? Vitamin D, Ergocalciferol, (DRISDOL) 50000 units CAPS capsule     ? ?No current facility-administered medications for this visit.  ? ? ?Family History  ?Problem Relation Age of Onset  ?  Diabetes Mother   ? Heart disease Mother   ? Hypertension Mother   ? Heart failure Mother   ? Liver disease Father   ? Diabetes Father   ? Diabetes Sister   ? Hypertension Sister   ? Breast cancer Maternal Aunt   ? Colon cancer Neg Hx   ? Colon polyps Neg Hx   ? Esophageal cancer Neg Hx   ? Rectal cancer Neg Hx   ? Stomach cancer Neg Hx   ? ? ?Review of Systems  ?All other systems reviewed and are negative. ? ?Exam:   ?BP 138/78   Pulse 81   Ht '4\' 11"'$  (1.499 m)   Wt 150 lb (68 kg)   LMP 11/20/2001 (Approximate)   SpO2 98%   BMI 30.30 kg/m?     ?General appearance: alert, cooperative and appears stated age ?Head: normocephalic, without obvious abnormality, atraumatic ?Neck: no adenopathy, supple, symmetrical, trachea midline and thyroid normal to inspection and palpation ?Lungs: clear to auscultation bilaterally ?Breasts: normal appearance, no masses or tenderness, No nipple retraction or dimpling, No nipple discharge  or bleeding, No axillary adenopathy ?Heart: regular rate and rhythm ?Abdomen: soft, non-tender; no masses, no organomegaly ?Extremities: extremities normal, atraumatic, no cyanosis or edema ?Skin: skin color, texture, turgor normal. No rashes or lesions ?Lymph nodes: cervical, supraclavicular, and axillary nodes normal. ?Neurologic: grossly normal ? ?Pelvic: External genitalia:  fusion of labia minora and majora on left.   ?             No abnormal inguinal nodes palpated. ?             Urethra:  normal appearing urethra with no masses, tenderness or lesions ?             Bartholins and Skenes: normal    ?             Vagina: normal appearing vagina with normal color and discharge, no lesions ?             Cervix: no lesions ?             Pap taken: yes ?Bimanual Exam:  Uterus:  normal size, contour, position, consistency, mobility, non-tender ?             Adnexa: no mass, fullness, tenderness ?             Rectal exam: yes.  Confirms. ?             Anus:  normal sphincter tone, no lesions ? ?Chaperone was present for exam:  Estill Bamberg, CMA ? ?Assessment:   ?Well woman visit with gynecologic exam. ?Cervical cancer screening. ?Hx chronic vulvitis.  Biopsy shows hyperkeratosis and spongiotic dermatitis.  ?Osteopenia.   Off Actonel.  PCP managing.  ?Remote hx of abnormal pap. ? ?Plan: ?Mammogram screening discussed. ?Self breast awareness reviewed. ?Pap and HR HPV collected today.  ?Guidelines for Calcium, Vitamin D, regular exercise program including cardiovascular and weight bearing exercise. ?Rx for Triamcinolone ointment 0.25% to vulva bid x 1 -2 weeks prn and then one to two times per week at hs for maintenance dosing.  ?Next routine breast, pelvic and pap in 2 years.  ?FU also prn.  ? ?After visit summary provided.  ? ? ? ? ? ?

## 2022-02-09 ENCOUNTER — Ambulatory Visit (INDEPENDENT_AMBULATORY_CARE_PROVIDER_SITE_OTHER): Payer: Medicare HMO | Admitting: Obstetrics and Gynecology

## 2022-02-09 ENCOUNTER — Other Ambulatory Visit: Payer: Self-pay

## 2022-02-09 ENCOUNTER — Encounter: Payer: Self-pay | Admitting: Obstetrics and Gynecology

## 2022-02-09 ENCOUNTER — Other Ambulatory Visit (HOSPITAL_COMMUNITY)
Admission: RE | Admit: 2022-02-09 | Discharge: 2022-02-09 | Disposition: A | Discharge status: Home / Self Care | Payer: Medicare HMO | Source: Ambulatory Visit | Attending: Obstetrics and Gynecology | Admitting: Obstetrics and Gynecology

## 2022-02-09 VITALS — BP 138/78 | HR 81 | Ht 59.0 in | Wt 150.0 lb

## 2022-02-09 DIAGNOSIS — Z124 Encounter for screening for malignant neoplasm of cervix: Secondary | ICD-10-CM

## 2022-02-09 DIAGNOSIS — N763 Subacute and chronic vulvitis: Secondary | ICD-10-CM | POA: Diagnosis not present

## 2022-02-09 DIAGNOSIS — Z01419 Encounter for gynecological examination (general) (routine) without abnormal findings: Secondary | ICD-10-CM | POA: Diagnosis not present

## 2022-02-09 DIAGNOSIS — Z1151 Encounter for screening for human papillomavirus (HPV): Secondary | ICD-10-CM | POA: Insufficient documentation

## 2022-02-09 DIAGNOSIS — R69 Illness, unspecified: Secondary | ICD-10-CM | POA: Diagnosis not present

## 2022-02-09 MED ORDER — TRIAMCINOLONE ACETONIDE 0.025 % EX OINT: TOPICAL_OINTMENT | CUTANEOUS | 1 refills | Status: AC

## 2022-02-09 NOTE — Patient Instructions (Signed)

## 2022-02-14 LAB — CYTOLOGY - PAP
Comment: NEGATIVE
Diagnosis: NEGATIVE
High risk HPV: NEGATIVE

## 2022-03-02 ENCOUNTER — Encounter: Payer: Self-pay | Admitting: Obstetrics and Gynecology

## 2022-11-09 DIAGNOSIS — Z1231 Encounter for screening mammogram for malignant neoplasm of breast: Secondary | ICD-10-CM | POA: Diagnosis not present

## 2023-01-10 DIAGNOSIS — I10 Essential (primary) hypertension: Secondary | ICD-10-CM | POA: Diagnosis not present

## 2023-01-10 DIAGNOSIS — E785 Hyperlipidemia, unspecified: Secondary | ICD-10-CM | POA: Diagnosis not present

## 2023-01-10 DIAGNOSIS — K219 Gastro-esophageal reflux disease without esophagitis: Secondary | ICD-10-CM | POA: Diagnosis not present

## 2023-01-10 DIAGNOSIS — E559 Vitamin D deficiency, unspecified: Secondary | ICD-10-CM | POA: Diagnosis not present

## 2023-01-10 DIAGNOSIS — R739 Hyperglycemia, unspecified: Secondary | ICD-10-CM | POA: Diagnosis not present

## 2023-01-10 DIAGNOSIS — R7989 Other specified abnormal findings of blood chemistry: Secondary | ICD-10-CM | POA: Diagnosis not present

## 2023-01-17 DIAGNOSIS — R7989 Other specified abnormal findings of blood chemistry: Secondary | ICD-10-CM | POA: Diagnosis not present

## 2023-01-17 DIAGNOSIS — K760 Fatty (change of) liver, not elsewhere classified: Secondary | ICD-10-CM | POA: Diagnosis not present

## 2023-01-17 DIAGNOSIS — Z1339 Encounter for screening examination for other mental health and behavioral disorders: Secondary | ICD-10-CM | POA: Diagnosis not present

## 2023-01-17 DIAGNOSIS — I10 Essential (primary) hypertension: Secondary | ICD-10-CM | POA: Diagnosis not present

## 2023-01-17 DIAGNOSIS — M810 Age-related osteoporosis without current pathological fracture: Secondary | ICD-10-CM | POA: Diagnosis not present

## 2023-01-17 DIAGNOSIS — Z0000 Encounter for general adult medical examination without abnormal findings: Secondary | ICD-10-CM | POA: Diagnosis not present

## 2023-01-17 DIAGNOSIS — E559 Vitamin D deficiency, unspecified: Secondary | ICD-10-CM | POA: Diagnosis not present

## 2023-01-17 DIAGNOSIS — K219 Gastro-esophageal reflux disease without esophagitis: Secondary | ICD-10-CM | POA: Diagnosis not present

## 2023-01-17 DIAGNOSIS — E785 Hyperlipidemia, unspecified: Secondary | ICD-10-CM | POA: Diagnosis not present

## 2023-01-17 DIAGNOSIS — M1990 Unspecified osteoarthritis, unspecified site: Secondary | ICD-10-CM | POA: Diagnosis not present

## 2023-01-17 DIAGNOSIS — R7303 Prediabetes: Secondary | ICD-10-CM | POA: Diagnosis not present

## 2023-01-17 DIAGNOSIS — Z1331 Encounter for screening for depression: Secondary | ICD-10-CM | POA: Diagnosis not present

## 2023-03-07 DIAGNOSIS — I10 Essential (primary) hypertension: Secondary | ICD-10-CM | POA: Diagnosis not present

## 2023-03-07 DIAGNOSIS — E785 Hyperlipidemia, unspecified: Secondary | ICD-10-CM | POA: Diagnosis not present

## 2023-03-07 DIAGNOSIS — K219 Gastro-esophageal reflux disease without esophagitis: Secondary | ICD-10-CM | POA: Diagnosis not present

## 2023-04-18 DIAGNOSIS — S70362A Insect bite (nonvenomous), left thigh, initial encounter: Secondary | ICD-10-CM | POA: Diagnosis not present

## 2023-04-18 DIAGNOSIS — W57XXXA Bitten or stung by nonvenomous insect and other nonvenomous arthropods, initial encounter: Secondary | ICD-10-CM | POA: Diagnosis not present

## 2023-04-18 DIAGNOSIS — S20162A Insect bite (nonvenomous) of breast, left breast, initial encounter: Secondary | ICD-10-CM | POA: Diagnosis not present

## 2023-08-27 DIAGNOSIS — M51369 Other intervertebral disc degeneration, lumbar region without mention of lumbar back pain or lower extremity pain: Secondary | ICD-10-CM | POA: Diagnosis not present

## 2023-08-27 DIAGNOSIS — M5451 Vertebrogenic low back pain: Secondary | ICD-10-CM | POA: Diagnosis not present

## 2023-11-16 DIAGNOSIS — Z1231 Encounter for screening mammogram for malignant neoplasm of breast: Secondary | ICD-10-CM | POA: Diagnosis not present

## 2023-12-31 DIAGNOSIS — D235 Other benign neoplasm of skin of trunk: Secondary | ICD-10-CM | POA: Diagnosis not present

## 2023-12-31 DIAGNOSIS — L814 Other melanin hyperpigmentation: Secondary | ICD-10-CM | POA: Diagnosis not present

## 2023-12-31 DIAGNOSIS — L820 Inflamed seborrheic keratosis: Secondary | ICD-10-CM | POA: Diagnosis not present

## 2023-12-31 DIAGNOSIS — L579 Skin changes due to chronic exposure to nonionizing radiation, unspecified: Secondary | ICD-10-CM | POA: Diagnosis not present

## 2023-12-31 DIAGNOSIS — D225 Melanocytic nevi of trunk: Secondary | ICD-10-CM | POA: Diagnosis not present

## 2023-12-31 DIAGNOSIS — D485 Neoplasm of uncertain behavior of skin: Secondary | ICD-10-CM | POA: Diagnosis not present

## 2023-12-31 DIAGNOSIS — C44622 Squamous cell carcinoma of skin of right upper limb, including shoulder: Secondary | ICD-10-CM | POA: Diagnosis not present

## 2023-12-31 DIAGNOSIS — L821 Other seborrheic keratosis: Secondary | ICD-10-CM | POA: Diagnosis not present

## 2023-12-31 DIAGNOSIS — L981 Factitial dermatitis: Secondary | ICD-10-CM | POA: Diagnosis not present

## 2023-12-31 DIAGNOSIS — L853 Xerosis cutis: Secondary | ICD-10-CM | POA: Diagnosis not present

## 2024-01-17 DIAGNOSIS — I10 Essential (primary) hypertension: Secondary | ICD-10-CM | POA: Diagnosis not present

## 2024-01-17 DIAGNOSIS — E785 Hyperlipidemia, unspecified: Secondary | ICD-10-CM | POA: Diagnosis not present

## 2024-01-17 DIAGNOSIS — E559 Vitamin D deficiency, unspecified: Secondary | ICD-10-CM | POA: Diagnosis not present

## 2024-01-17 DIAGNOSIS — M810 Age-related osteoporosis without current pathological fracture: Secondary | ICD-10-CM | POA: Diagnosis not present

## 2024-01-25 DIAGNOSIS — R82998 Other abnormal findings in urine: Secondary | ICD-10-CM | POA: Diagnosis not present

## 2024-01-29 DIAGNOSIS — C44622 Squamous cell carcinoma of skin of right upper limb, including shoulder: Secondary | ICD-10-CM | POA: Diagnosis not present

## 2024-01-29 DIAGNOSIS — C44621 Squamous cell carcinoma of skin of unspecified upper limb, including shoulder: Secondary | ICD-10-CM | POA: Diagnosis not present

## 2024-01-29 DIAGNOSIS — L905 Scar conditions and fibrosis of skin: Secondary | ICD-10-CM | POA: Diagnosis not present

## 2024-01-29 DIAGNOSIS — C44722 Squamous cell carcinoma of skin of right lower limb, including hip: Secondary | ICD-10-CM | POA: Diagnosis not present

## 2024-01-29 DIAGNOSIS — D485 Neoplasm of uncertain behavior of skin: Secondary | ICD-10-CM | POA: Diagnosis not present

## 2024-02-06 ENCOUNTER — Ambulatory Visit

## 2024-02-26 DIAGNOSIS — L905 Scar conditions and fibrosis of skin: Secondary | ICD-10-CM | POA: Diagnosis not present

## 2024-02-26 DIAGNOSIS — C44722 Squamous cell carcinoma of skin of right lower limb, including hip: Secondary | ICD-10-CM | POA: Diagnosis not present

## 2024-03-17 DIAGNOSIS — J0191 Acute recurrent sinusitis, unspecified: Secondary | ICD-10-CM | POA: Diagnosis not present

## 2024-03-19 DIAGNOSIS — G5603 Carpal tunnel syndrome, bilateral upper limbs: Secondary | ICD-10-CM | POA: Diagnosis not present

## 2024-03-19 DIAGNOSIS — M1812 Unilateral primary osteoarthritis of first carpometacarpal joint, left hand: Secondary | ICD-10-CM | POA: Diagnosis not present

## 2024-03-19 DIAGNOSIS — M654 Radial styloid tenosynovitis [de Quervain]: Secondary | ICD-10-CM | POA: Diagnosis not present

## 2024-04-02 DIAGNOSIS — A499 Bacterial infection, unspecified: Secondary | ICD-10-CM | POA: Diagnosis not present

## 2024-06-12 DIAGNOSIS — H5213 Myopia, bilateral: Secondary | ICD-10-CM | POA: Diagnosis not present

## 2024-06-24 DIAGNOSIS — R1030 Lower abdominal pain, unspecified: Secondary | ICD-10-CM | POA: Diagnosis not present

## 2024-06-24 DIAGNOSIS — R197 Diarrhea, unspecified: Secondary | ICD-10-CM | POA: Diagnosis not present

## 2024-06-26 DIAGNOSIS — N179 Acute kidney failure, unspecified: Secondary | ICD-10-CM | POA: Diagnosis not present

## 2024-07-30 DIAGNOSIS — L821 Other seborrheic keratosis: Secondary | ICD-10-CM | POA: Diagnosis not present

## 2024-07-30 DIAGNOSIS — L579 Skin changes due to chronic exposure to nonionizing radiation, unspecified: Secondary | ICD-10-CM | POA: Diagnosis not present

## 2024-07-30 DIAGNOSIS — L814 Other melanin hyperpigmentation: Secondary | ICD-10-CM | POA: Diagnosis not present

## 2024-07-30 DIAGNOSIS — Z85828 Personal history of other malignant neoplasm of skin: Secondary | ICD-10-CM | POA: Diagnosis not present

## 2024-07-30 DIAGNOSIS — L570 Actinic keratosis: Secondary | ICD-10-CM | POA: Diagnosis not present

## 2024-07-30 DIAGNOSIS — D235 Other benign neoplasm of skin of trunk: Secondary | ICD-10-CM | POA: Diagnosis not present

## 2024-09-22 DIAGNOSIS — L570 Actinic keratosis: Secondary | ICD-10-CM | POA: Diagnosis not present

## 2024-10-29 DIAGNOSIS — G5603 Carpal tunnel syndrome, bilateral upper limbs: Secondary | ICD-10-CM | POA: Diagnosis not present

## 2024-10-29 DIAGNOSIS — M654 Radial styloid tenosynovitis [de Quervain]: Secondary | ICD-10-CM | POA: Diagnosis not present

## 2024-10-29 DIAGNOSIS — M1812 Unilateral primary osteoarthritis of first carpometacarpal joint, left hand: Secondary | ICD-10-CM | POA: Diagnosis not present
